# Patient Record
Sex: Female | Born: 1980
Health system: Southern US, Community
[De-identification: ages and names within clinical notes are randomized; demographics above are authoritative.]

## PROBLEM LIST (undated history)

## (undated) DIAGNOSIS — F419 Anxiety disorder, unspecified: Secondary | ICD-10-CM

## (undated) DIAGNOSIS — F32A Depression, unspecified: Secondary | ICD-10-CM

## (undated) DIAGNOSIS — E785 Hyperlipidemia, unspecified: Secondary | ICD-10-CM

## (undated) DIAGNOSIS — R451 Restlessness and agitation: Secondary | ICD-10-CM

## (undated) DIAGNOSIS — F329 Major depressive disorder, single episode, unspecified: Secondary | ICD-10-CM

## (undated) DIAGNOSIS — G4710 Hypersomnia, unspecified: Secondary | ICD-10-CM

## (undated) DIAGNOSIS — R002 Palpitations: Secondary | ICD-10-CM

## (undated) HISTORY — DX: Depression, unspecified: F32.A

## (undated) HISTORY — DX: Restlessness and agitation: R45.1

## (undated) HISTORY — DX: Palpitations: R00.2

## (undated) HISTORY — DX: Anxiety disorder, unspecified: F41.9

## (undated) HISTORY — PX: OTHER SURGICAL HISTORY: SHX169

## (undated) HISTORY — DX: Hypersomnia, unspecified: G47.10

## (undated) HISTORY — DX: Hyperlipidemia, unspecified: E78.5

## (undated) HISTORY — DX: Major depressive disorder, single episode, unspecified: F32.9

---

## 2001-03-04 ENCOUNTER — Ambulatory Visit (HOSPITAL_COMMUNITY): Admission: RE | Admit: 2001-03-04 | Discharge: 2001-03-04 | Payer: Self-pay | Admitting: Family Medicine

## 2004-02-10 ENCOUNTER — Other Ambulatory Visit: Admission: RE | Admit: 2004-02-10 | Discharge: 2004-02-10 | Payer: Self-pay | Admitting: Obstetrics and Gynecology

## 2004-10-27 ENCOUNTER — Ambulatory Visit (HOSPITAL_COMMUNITY): Admission: RE | Admit: 2004-10-27 | Discharge: 2004-10-27 | Payer: Self-pay | Admitting: *Deleted

## 2004-10-27 ENCOUNTER — Ambulatory Visit (HOSPITAL_BASED_OUTPATIENT_CLINIC_OR_DEPARTMENT_OTHER): Admission: RE | Admit: 2004-10-27 | Discharge: 2004-10-27 | Payer: Self-pay | Admitting: *Deleted

## 2004-10-27 ENCOUNTER — Encounter (INDEPENDENT_AMBULATORY_CARE_PROVIDER_SITE_OTHER): Payer: Self-pay | Admitting: *Deleted

## 2007-07-11 ENCOUNTER — Encounter: Admission: RE | Admit: 2007-07-11 | Discharge: 2007-07-11 | Payer: Self-pay | Admitting: Obstetrics and Gynecology

## 2009-11-09 ENCOUNTER — Ambulatory Visit (HOSPITAL_COMMUNITY): Admission: RE | Admit: 2009-11-09 | Discharge: 2009-11-09 | Payer: Self-pay | Admitting: Family Medicine

## 2010-04-25 ENCOUNTER — Ambulatory Visit: Payer: Self-pay | Admitting: Oncology

## 2010-04-27 LAB — LACTATE DEHYDROGENASE: LDH: 103 U/L (ref 94–250)

## 2010-04-27 LAB — COMPREHENSIVE METABOLIC PANEL
CO2: 24 mEq/L (ref 19–32)
Calcium: 9.3 mg/dL (ref 8.4–10.5)
Chloride: 107 mEq/L (ref 96–112)
Glucose, Bld: 100 mg/dL — ABNORMAL HIGH (ref 70–99)
Sodium: 138 mEq/L (ref 135–145)
Total Bilirubin: 0.6 mg/dL (ref 0.3–1.2)
Total Protein: 6.4 g/dL (ref 6.0–8.3)

## 2010-04-27 LAB — CBC WITH DIFFERENTIAL/PLATELET
Basophils Absolute: 0 10*3/uL (ref 0.0–0.1)
EOS%: 0.7 % (ref 0.0–7.0)
MCH: 31.6 pg (ref 25.1–34.0)
MCV: 88.3 fL (ref 79.5–101.0)
MONO%: 8.7 % (ref 0.0–14.0)
RBC: 3.72 10*6/uL (ref 3.70–5.45)
RDW: 13.6 % (ref 11.2–14.5)

## 2010-04-27 LAB — MORPHOLOGY

## 2010-04-28 LAB — HIV ANTIBODY (ROUTINE TESTING W REFLEX)

## 2010-04-28 LAB — HEPATITIS B SURFACE ANTIGEN: Hepatitis B Surface Ag: NEGATIVE

## 2010-04-28 LAB — TSH: TSH: 0.797 u[IU]/mL (ref 0.350–4.500)

## 2010-05-10 LAB — CBC WITH DIFFERENTIAL/PLATELET
BASO%: 0.3 % (ref 0.0–2.0)
EOS%: 0.6 % (ref 0.0–7.0)
HCT: 32 % — ABNORMAL LOW (ref 34.8–46.6)
MCH: 32.2 pg (ref 25.1–34.0)
MCHC: 35.4 g/dL (ref 31.5–36.0)
NEUT%: 79 % — ABNORMAL HIGH (ref 38.4–76.8)
RBC: 3.53 10*6/uL — ABNORMAL LOW (ref 3.70–5.45)
lymph#: 1.2 10*3/uL (ref 0.9–3.3)

## 2010-05-11 ENCOUNTER — Ambulatory Visit (HOSPITAL_COMMUNITY): Admission: RE | Admit: 2010-05-11 | Discharge: 2010-05-11 | Payer: Self-pay | Admitting: Obstetrics

## 2010-05-24 LAB — COMPREHENSIVE METABOLIC PANEL
AST: 17 U/L (ref 0–37)
Albumin: 3.5 g/dL (ref 3.5–5.2)
Alkaline Phosphatase: 52 U/L (ref 39–117)
Potassium: 3.8 mEq/L (ref 3.5–5.3)
Sodium: 137 mEq/L (ref 135–145)
Total Protein: 5.7 g/dL — ABNORMAL LOW (ref 6.0–8.3)

## 2010-05-24 LAB — CBC WITH DIFFERENTIAL/PLATELET
BASO%: 0.4 % (ref 0.0–2.0)
EOS%: 0.7 % (ref 0.0–7.0)
HGB: 12.1 g/dL (ref 11.6–15.9)
MCH: 31.4 pg (ref 25.1–34.0)
MCHC: 34.2 g/dL (ref 31.5–36.0)
RDW: 14.5 % (ref 11.2–14.5)
lymph#: 1.1 10*3/uL (ref 0.9–3.3)

## 2010-06-08 ENCOUNTER — Ambulatory Visit (HOSPITAL_COMMUNITY): Admission: RE | Admit: 2010-06-08 | Discharge: 2010-06-08 | Payer: Self-pay | Admitting: Obstetrics

## 2010-06-28 ENCOUNTER — Ambulatory Visit: Payer: Self-pay | Admitting: Oncology

## 2010-06-30 LAB — COMPREHENSIVE METABOLIC PANEL
ALT: 13 U/L (ref 0–35)
AST: 15 U/L (ref 0–37)
Albumin: 3.6 g/dL (ref 3.5–5.2)
Calcium: 8.4 mg/dL (ref 8.4–10.5)
Chloride: 105 mEq/L (ref 96–112)
Creatinine, Ser: 0.58 mg/dL (ref 0.40–1.20)
Potassium: 3.9 mEq/L (ref 3.5–5.3)
Sodium: 138 mEq/L (ref 135–145)
Total Protein: 5.9 g/dL — ABNORMAL LOW (ref 6.0–8.3)

## 2010-06-30 LAB — CBC WITH DIFFERENTIAL/PLATELET
BASO%: 0.1 % (ref 0.0–2.0)
Eosinophils Absolute: 0.1 10*3/uL (ref 0.0–0.5)
MCHC: 35.1 g/dL (ref 31.5–36.0)
MONO#: 0.5 10*3/uL (ref 0.1–0.9)
NEUT#: 7.1 10*3/uL — ABNORMAL HIGH (ref 1.5–6.5)
RBC: 3.95 10*6/uL (ref 3.70–5.45)
RDW: 14.2 % (ref 11.2–14.5)
WBC: 8.8 10*3/uL (ref 3.9–10.3)
lymph#: 1 10*3/uL (ref 0.9–3.3)

## 2010-06-30 LAB — CHCC SMEAR

## 2010-07-07 ENCOUNTER — Ambulatory Visit (HOSPITAL_COMMUNITY): Admission: RE | Admit: 2010-07-07 | Discharge: 2010-07-07 | Payer: Self-pay | Admitting: Obstetrics

## 2010-07-31 ENCOUNTER — Ambulatory Visit: Payer: Self-pay | Admitting: Oncology

## 2010-07-31 LAB — COMPREHENSIVE METABOLIC PANEL
ALT: 10 U/L (ref 0–35)
CO2: 21 mEq/L (ref 19–32)
Calcium: 8.3 mg/dL — ABNORMAL LOW (ref 8.4–10.5)
Chloride: 106 mEq/L (ref 96–112)
Glucose, Bld: 79 mg/dL (ref 70–99)
Sodium: 137 mEq/L (ref 135–145)
Total Protein: 5.4 g/dL — ABNORMAL LOW (ref 6.0–8.3)

## 2010-07-31 LAB — CBC WITH DIFFERENTIAL/PLATELET
EOS%: 0.3 % (ref 0.0–7.0)
Eosinophils Absolute: 0 10*3/uL (ref 0.0–0.5)
LYMPH%: 15.7 % (ref 14.0–49.7)
MCH: 31.7 pg (ref 25.1–34.0)
MCV: 91 fL (ref 79.5–101.0)
MONO%: 8.4 % (ref 0.0–14.0)
NEUT#: 6.4 10*3/uL (ref 1.5–6.5)
Platelets: 106 10*3/uL — ABNORMAL LOW (ref 145–400)
RBC: 3.8 10*6/uL (ref 3.70–5.45)
RDW: 14.1 % (ref 11.2–14.5)

## 2010-07-31 LAB — MORPHOLOGY: RBC Comments: NORMAL

## 2010-07-31 LAB — CHCC SMEAR

## 2010-08-02 ENCOUNTER — Encounter: Admission: RE | Admit: 2010-08-02 | Discharge: 2010-09-07 | Payer: Self-pay | Admitting: Obstetrics

## 2010-08-31 ENCOUNTER — Ambulatory Visit: Payer: Self-pay | Admitting: Oncology

## 2010-08-31 LAB — COMPREHENSIVE METABOLIC PANEL
ALT: 15 U/L (ref 0–35)
AST: 18 U/L (ref 0–37)
Albumin: 3.1 g/dL — ABNORMAL LOW (ref 3.5–5.2)
Alkaline Phosphatase: 101 U/L (ref 39–117)
BUN: 8 mg/dL (ref 6–23)
Calcium: 9 mg/dL (ref 8.4–10.5)
Chloride: 104 mEq/L (ref 96–112)
Potassium: 4.1 mEq/L (ref 3.5–5.3)
Sodium: 135 mEq/L (ref 135–145)
Total Protein: 6 g/dL (ref 6.0–8.3)

## 2010-08-31 LAB — URINALYSIS, MICROSCOPIC - CHCC
Bilirubin (Urine): NEGATIVE
Blood: NEGATIVE
Ketones: 15 mg/dL
Nitrite: NEGATIVE
Protein: NEGATIVE mg/dL
Specific Gravity, Urine: 1.01 (ref 1.003–1.035)
pH: 6.5 (ref 4.6–8.0)

## 2010-08-31 LAB — MORPHOLOGY: PLT EST: DECREASED

## 2010-08-31 LAB — CBC WITH DIFFERENTIAL/PLATELET
BASO%: 0.6 % (ref 0.0–2.0)
Basophils Absolute: 0.1 10*3/uL (ref 0.0–0.1)
HCT: 38.9 % (ref 34.8–46.6)
HGB: 13.3 g/dL (ref 11.6–15.9)
LYMPH%: 12.7 % — ABNORMAL LOW (ref 14.0–49.7)
MCH: 31.3 pg (ref 25.1–34.0)
MCHC: 34 g/dL (ref 31.5–36.0)
MONO#: 0.6 10*3/uL (ref 0.1–0.9)
NEUT%: 78.5 % — ABNORMAL HIGH (ref 38.4–76.8)
Platelets: 94 10*3/uL — ABNORMAL LOW (ref 145–400)
WBC: 8.2 10*3/uL (ref 3.9–10.3)

## 2010-09-01 LAB — URINE CULTURE

## 2010-09-07 LAB — CBC WITH DIFFERENTIAL/PLATELET
BASO%: 0.1 % (ref 0.0–2.0)
Basophils Absolute: 0 10*3/uL (ref 0.0–0.1)
EOS%: 0 % (ref 0.0–7.0)
HCT: 37.4 % (ref 34.8–46.6)
HGB: 12.7 g/dL (ref 11.6–15.9)
LYMPH%: 9 % — ABNORMAL LOW (ref 14.0–49.7)
MCH: 31 pg (ref 25.1–34.0)
MCHC: 34 g/dL (ref 31.5–36.0)
MCV: 91.2 fL (ref 79.5–101.0)
MONO%: 3.8 % (ref 0.0–14.0)
NEUT%: 87.1 % — ABNORMAL HIGH (ref 38.4–76.8)
Platelets: 108 10*3/uL — ABNORMAL LOW (ref 145–400)
lymph#: 1 10*3/uL (ref 0.9–3.3)

## 2010-09-14 ENCOUNTER — Ambulatory Visit: Payer: Self-pay | Admitting: Oncology

## 2010-09-14 LAB — COMPREHENSIVE METABOLIC PANEL
AST: 16 U/L (ref 0–37)
BUN: 11 mg/dL (ref 6–23)
CO2: 23 mEq/L (ref 19–32)
Calcium: 9.3 mg/dL (ref 8.4–10.5)
Chloride: 104 mEq/L (ref 96–112)
Creatinine, Ser: 0.68 mg/dL (ref 0.40–1.20)

## 2010-09-14 LAB — CBC WITH DIFFERENTIAL/PLATELET
Basophils Absolute: 0 10*3/uL (ref 0.0–0.1)
EOS%: 0.1 % (ref 0.0–7.0)
HCT: 39 % (ref 34.8–46.6)
HGB: 13.6 g/dL (ref 11.6–15.9)
LYMPH%: 8.7 % — ABNORMAL LOW (ref 14.0–49.7)
MCH: 31.8 pg (ref 25.1–34.0)
NEUT%: 87.9 % — ABNORMAL HIGH (ref 38.4–76.8)
Platelets: 130 10*3/uL — ABNORMAL LOW (ref 145–400)
lymph#: 0.9 10*3/uL (ref 0.9–3.3)

## 2010-09-21 ENCOUNTER — Inpatient Hospital Stay (HOSPITAL_COMMUNITY): Admission: AD | Admit: 2010-09-21 | Discharge: 2010-09-21 | Payer: Self-pay | Admitting: Obstetrics

## 2010-09-21 LAB — CBC WITH DIFFERENTIAL/PLATELET
Basophils Absolute: 0 10*3/uL (ref 0.0–0.1)
Eosinophils Absolute: 0 10*3/uL (ref 0.0–0.5)
HCT: 43.1 % (ref 34.8–46.6)
HGB: 14.9 g/dL (ref 11.6–15.9)
MONO#: 0.2 10*3/uL (ref 0.1–0.9)
NEUT%: 87.6 % — ABNORMAL HIGH (ref 38.4–76.8)
WBC: 11.6 10*3/uL — ABNORMAL HIGH (ref 3.9–10.3)
lymph#: 1.2 10*3/uL (ref 0.9–3.3)

## 2010-09-23 ENCOUNTER — Inpatient Hospital Stay (HOSPITAL_COMMUNITY): Admission: RE | Admit: 2010-09-23 | Discharge: 2010-09-26 | Payer: Self-pay | Admitting: Obstetrics

## 2010-10-02 LAB — CBC WITH DIFFERENTIAL/PLATELET
Basophils Absolute: 0.1 10*3/uL (ref 0.0–0.1)
EOS%: 0.2 % (ref 0.0–7.0)
HCT: 33.6 % — ABNORMAL LOW (ref 34.8–46.6)
HGB: 11.6 g/dL (ref 11.6–15.9)
MCH: 31.5 pg (ref 25.1–34.0)
MCV: 91.6 fL (ref 79.5–101.0)
MONO%: 3.8 % (ref 0.0–14.0)
NEUT%: 88.1 % — ABNORMAL HIGH (ref 38.4–76.8)
Platelets: 222 10*3/uL (ref 145–400)
lymph#: 0.8 10*3/uL — ABNORMAL LOW (ref 0.9–3.3)

## 2010-10-02 LAB — COMPREHENSIVE METABOLIC PANEL
AST: 14 U/L (ref 0–37)
BUN: 24 mg/dL — ABNORMAL HIGH (ref 6–23)
Calcium: 9.5 mg/dL (ref 8.4–10.5)
Chloride: 105 mEq/L (ref 96–112)
Creatinine, Ser: 0.76 mg/dL (ref 0.40–1.20)

## 2010-10-09 LAB — CBC WITH DIFFERENTIAL/PLATELET
Basophils Absolute: 0 10*3/uL (ref 0.0–0.1)
EOS%: 0.1 % (ref 0.0–7.0)
Eosinophils Absolute: 0 10*3/uL (ref 0.0–0.5)
HCT: 37.2 % (ref 34.8–46.6)
HGB: 12.4 g/dL (ref 11.6–15.9)
MCH: 30.2 pg (ref 25.1–34.0)
MONO#: 0.3 10*3/uL (ref 0.1–0.9)
NEUT%: 88.6 % — ABNORMAL HIGH (ref 38.4–76.8)
lymph#: 0.9 10*3/uL (ref 0.9–3.3)

## 2010-10-16 ENCOUNTER — Other Ambulatory Visit: Payer: Self-pay | Admitting: Oncology

## 2010-10-16 LAB — CBC WITH DIFFERENTIAL/PLATELET
Basophils Absolute: 0 10*3/uL (ref 0.0–0.1)
Eosinophils Absolute: 0 10*3/uL (ref 0.0–0.5)
HCT: 37.7 % (ref 34.8–46.6)
HGB: 12.7 g/dL (ref 11.6–15.9)
LYMPH%: 15.7 % (ref 14.0–49.7)
MONO#: 0.4 10*3/uL (ref 0.1–0.9)
NEUT#: 5.3 10*3/uL (ref 1.5–6.5)
Platelets: 192 10*3/uL (ref 145–400)
RBC: 4.23 10*6/uL (ref 3.70–5.45)
WBC: 6.7 10*3/uL (ref 3.9–10.3)

## 2010-10-25 ENCOUNTER — Other Ambulatory Visit: Payer: Self-pay | Admitting: Oncology

## 2010-10-25 LAB — COMPREHENSIVE METABOLIC PANEL
CO2: 26 mEq/L (ref 19–32)
Creatinine, Ser: 0.92 mg/dL (ref 0.40–1.20)
Glucose, Bld: 130 mg/dL — ABNORMAL HIGH (ref 70–99)
Total Bilirubin: 0.4 mg/dL (ref 0.3–1.2)

## 2010-10-25 LAB — CBC WITH DIFFERENTIAL/PLATELET
BASO%: 0.4 % (ref 0.0–2.0)
Eosinophils Absolute: 0.1 10*3/uL (ref 0.0–0.5)
HCT: 39.4 % (ref 34.8–46.6)
LYMPH%: 14.7 % (ref 14.0–49.7)
MCHC: 33.3 g/dL (ref 31.5–36.0)
MCV: 88 fL (ref 79.5–101.0)
MONO#: 0.5 10*3/uL (ref 0.1–0.9)
NEUT%: 78 % — ABNORMAL HIGH (ref 38.4–76.8)
Platelets: 167 10*3/uL (ref 145–400)
WBC: 7.6 10*3/uL (ref 3.9–10.3)

## 2010-10-30 ENCOUNTER — Other Ambulatory Visit: Payer: Self-pay | Admitting: Oncology

## 2010-10-30 LAB — CBC WITH DIFFERENTIAL/PLATELET
Eosinophils Absolute: 0.1 10*3/uL (ref 0.0–0.5)
HCT: 36.8 % (ref 34.8–46.6)
LYMPH%: 24.4 % (ref 14.0–49.7)
MONO#: 0.7 10*3/uL (ref 0.1–0.9)
NEUT#: 4.2 10*3/uL (ref 1.5–6.5)
NEUT%: 63.4 % (ref 38.4–76.8)
Platelets: 151 10*3/uL (ref 145–400)
WBC: 6.7 10*3/uL (ref 3.9–10.3)

## 2010-11-08 ENCOUNTER — Other Ambulatory Visit: Payer: Self-pay | Admitting: Oncology

## 2010-11-08 LAB — CBC WITH DIFFERENTIAL/PLATELET
BASO%: 0.4 % (ref 0.0–2.0)
HCT: 38.9 % (ref 34.8–46.6)
LYMPH%: 29.3 % (ref 14.0–49.7)
MCH: 28.8 pg (ref 25.1–34.0)
MCHC: 33.3 g/dL (ref 31.5–36.0)
MONO#: 0.6 10*3/uL (ref 0.1–0.9)
NEUT%: 59.7 % (ref 38.4–76.8)
Platelets: 156 10*3/uL (ref 145–400)

## 2010-11-08 LAB — COMPREHENSIVE METABOLIC PANEL
ALT: 20 U/L (ref 0–35)
BUN: 19 mg/dL (ref 6–23)
CO2: 27 mEq/L (ref 19–32)
Creatinine, Ser: 0.86 mg/dL (ref 0.40–1.20)
Total Bilirubin: 0.4 mg/dL (ref 0.3–1.2)

## 2010-12-01 ENCOUNTER — Ambulatory Visit (HOSPITAL_BASED_OUTPATIENT_CLINIC_OR_DEPARTMENT_OTHER): Payer: 59 | Admitting: Oncology

## 2010-12-06 LAB — CBC WITH DIFFERENTIAL/PLATELET
Basophils Absolute: 0 10*3/uL (ref 0.0–0.1)
Eosinophils Absolute: 0.1 10*3/uL (ref 0.0–0.5)
HCT: 41.8 % (ref 34.8–46.6)
HGB: 14 g/dL (ref 11.6–15.9)
MCH: 28.4 pg (ref 25.1–34.0)
MONO#: 0.6 10*3/uL (ref 0.1–0.9)
NEUT#: 5 10*3/uL (ref 1.5–6.5)
NEUT%: 66.9 % (ref 38.4–76.8)
RDW: 14.5 % (ref 11.2–14.5)
WBC: 7.4 10*3/uL (ref 3.9–10.3)
lymph#: 1.8 10*3/uL (ref 0.9–3.3)

## 2010-12-31 ENCOUNTER — Encounter: Payer: Self-pay | Admitting: Obstetrics

## 2011-01-01 LAB — CBC WITH DIFFERENTIAL/PLATELET
BASO%: 0.3 % (ref 0.0–2.0)
Basophils Absolute: 0 10*3/uL (ref 0.0–0.1)
Eosinophils Absolute: 0 10*3/uL (ref 0.0–0.5)
HCT: 40.4 % (ref 34.8–46.6)
HGB: 13.5 g/dL (ref 11.6–15.9)
MCHC: 33.5 g/dL (ref 31.5–36.0)
MONO#: 0.4 10*3/uL (ref 0.1–0.9)
NEUT#: 3.8 10*3/uL (ref 1.5–6.5)
NEUT%: 64.3 % (ref 38.4–76.8)
Platelets: 130 10*3/uL — ABNORMAL LOW (ref 145–400)
WBC: 5.9 10*3/uL (ref 3.9–10.3)
lymph#: 1.7 10*3/uL (ref 0.9–3.3)

## 2011-01-15 ENCOUNTER — Encounter: Payer: 59 | Admitting: Oncology

## 2011-01-15 DIAGNOSIS — D696 Thrombocytopenia, unspecified: Secondary | ICD-10-CM

## 2011-01-15 DIAGNOSIS — IMO0002 Reserved for concepts with insufficient information to code with codable children: Secondary | ICD-10-CM

## 2011-01-15 DIAGNOSIS — O99810 Abnormal glucose complicating pregnancy: Secondary | ICD-10-CM

## 2011-01-15 LAB — CBC WITH DIFFERENTIAL/PLATELET
Basophils Absolute: 0 10*3/uL (ref 0.0–0.1)
EOS%: 0.5 % (ref 0.0–7.0)
HCT: 40.2 % (ref 34.8–46.6)
HGB: 13.6 g/dL (ref 11.6–15.9)
LYMPH%: 25.8 % (ref 14.0–49.7)
MCH: 28.2 pg (ref 25.1–34.0)
MCV: 83.7 fL (ref 79.5–101.0)
MONO%: 7.6 % (ref 0.0–14.0)
NEUT%: 65.6 % (ref 38.4–76.8)
Platelets: 144 10*3/uL — ABNORMAL LOW (ref 145–400)

## 2011-02-02 ENCOUNTER — Encounter (HOSPITAL_BASED_OUTPATIENT_CLINIC_OR_DEPARTMENT_OTHER): Payer: 59 | Admitting: Oncology

## 2011-02-02 ENCOUNTER — Other Ambulatory Visit: Payer: Self-pay | Admitting: Oncology

## 2011-02-02 DIAGNOSIS — IMO0002 Reserved for concepts with insufficient information to code with codable children: Secondary | ICD-10-CM

## 2011-02-02 DIAGNOSIS — O99810 Abnormal glucose complicating pregnancy: Secondary | ICD-10-CM

## 2011-02-02 DIAGNOSIS — D696 Thrombocytopenia, unspecified: Secondary | ICD-10-CM

## 2011-02-02 LAB — CBC WITH DIFFERENTIAL/PLATELET
Basophils Absolute: 0 10*3/uL (ref 0.0–0.1)
Eosinophils Absolute: 0 10*3/uL (ref 0.0–0.5)
HCT: 42.3 % (ref 34.8–46.6)
HGB: 14.3 g/dL (ref 11.6–15.9)
LYMPH%: 27.2 % (ref 14.0–49.7)
MCHC: 33.8 g/dL (ref 31.5–36.0)
MONO#: 0.4 10*3/uL (ref 0.1–0.9)
NEUT#: 3.7 10*3/uL (ref 1.5–6.5)
NEUT%: 64.5 % (ref 38.4–76.8)
Platelets: 159 10*3/uL (ref 145–400)
WBC: 5.8 10*3/uL (ref 3.9–10.3)

## 2011-02-02 LAB — COMPREHENSIVE METABOLIC PANEL
BUN: 12 mg/dL (ref 6–23)
CO2: 24 mEq/L (ref 19–32)
Calcium: 9.6 mg/dL (ref 8.4–10.5)
Chloride: 106 mEq/L (ref 96–112)
Creatinine, Ser: 1 mg/dL (ref 0.40–1.20)
Glucose, Bld: 92 mg/dL (ref 70–99)
Total Bilirubin: 0.5 mg/dL (ref 0.3–1.2)

## 2011-02-21 LAB — CBC
Hemoglobin: 12 g/dL (ref 12.0–15.0)
MCH: 31.9 pg (ref 26.0–34.0)
MCHC: 34.7 g/dL (ref 30.0–36.0)
MCV: 93.1 fL (ref 78.0–100.0)
MCV: 93.4 fL (ref 78.0–100.0)
Platelets: 107 10*3/uL — ABNORMAL LOW (ref 150–400)
Platelets: 117 10*3/uL — ABNORMAL LOW (ref 150–400)
Platelets: 122 10*3/uL — ABNORMAL LOW (ref 150–400)
RBC: 3.06 MIL/uL — ABNORMAL LOW (ref 3.87–5.11)
RBC: 4.27 MIL/uL (ref 3.87–5.11)
RDW: 14.7 % (ref 11.5–15.5)
WBC: 15.7 10*3/uL — ABNORMAL HIGH (ref 4.0–10.5)

## 2011-02-21 LAB — GLUCOSE, CAPILLARY
Glucose-Capillary: 107 mg/dL — ABNORMAL HIGH (ref 70–99)
Glucose-Capillary: 107 mg/dL — ABNORMAL HIGH (ref 70–99)
Glucose-Capillary: 169 mg/dL — ABNORMAL HIGH (ref 70–99)
Glucose-Capillary: 198 mg/dL — ABNORMAL HIGH (ref 70–99)
Glucose-Capillary: 225 mg/dL — ABNORMAL HIGH (ref 70–99)
Glucose-Capillary: 65 mg/dL — ABNORMAL LOW (ref 70–99)
Glucose-Capillary: 69 mg/dL — ABNORMAL LOW (ref 70–99)
Glucose-Capillary: 75 mg/dL (ref 70–99)
Glucose-Capillary: 77 mg/dL (ref 70–99)
Glucose-Capillary: 86 mg/dL (ref 70–99)

## 2011-04-27 NOTE — Op Note (Signed)
NAMEFAYDRA, Ashley Gardner              ACCOUNT NO.:  [id number]   MEDICAL RECORD NO.:  [id number]          PATIENT TYPE:  AMB   LOCATION:  DSC                          FACILITY:  [facility]   PHYSICIAN:  Kathy Breach, M.D.      DATE OF BIRTH:  1981/10/01   DATE OF PROCEDURE:  10/27/2004  DATE OF DISCHARGE:                                 OPERATIVE REPORT   PREOPERATIVE DIAGNOSIS:  Left chronic anterior ethmoid/maxillary sinusitis.   POSTOPERATIVE DIAGNOSIS:  Left chronic anterior ethmoid/maxillary sinusitis.   OPERATIVE PROCEDURE:  Endoscopic left anterior ethmoidectomy with  osteomeatal enlargement.   DESCRIPTION OF PROCEDURE:  With the patient under general orotracheal  anesthesia, a nasal block was applied to the sphenopalatine and anterior  ethmoid nerve areas bilaterally with cotton tipped probes soaked with 4%  Xylocaine with Ephedrine solution.  Cotton pledgets soaked in a similar  solution were inserted along the middle and inferior turbinates.  The left  middle meatal and middle turbinate were infiltrated with 1% Xylocaine with  1:100,000 epinephrine under direct visualization for vasoconstrictive  efforts.  Inspection of the patient's nose after decongestion showed a  little mucopurulent discharge posteriorly on the left side into the  nasopharynx.  Under endoscopic visualization, the left middle turbinate was  medially fractured to expose the left middle meatus.  Prominent infundibulum  was present.  With a sickle knife, incision was made along the inferior and  anterior aspects of the curved infundibulum which was then removed.  This  encountered immediately inflammatory polypoid mucosa filling the hiatus  space.  Anterior ethmoid cell was taken down filled with inflammatory  tissue.  The more anterior agar nasi cell opaque on the preop films was also  taken, obliterated, and removed.  The area of the osteomeatal window was  probed as it was not directly visible with the  inflammatory changes,  located, and opened.  There was light yellow creamy purulence within the  left maxillary sinus.  Culture was obtained.  The sinus was completely  aspirated of minimal contents at this time.  With the left osteomeatal  window nicely opened, the anterior ethmoidectomy completed, no significant  bleeding after topical packs with the 4% Xylocaine Ephedrine solution  reinserted for a few minutes.  Estimated blood loss for the procedure is  less than 30 mL.  The patient's oropharynx was suctioned, there was no  bleeding from the nasopharynx.  The patient tolerated the procedure well and  was taken to the recovery room in stable, general condition.       JGL/MEDQ  D:  10/27/2004  T:  10/27/2004  Job:  [id number]

## 2015-01-25 ENCOUNTER — Encounter: Payer: 59 | Attending: Obstetrics | Admitting: Dietician

## 2015-01-25 ENCOUNTER — Encounter: Payer: Self-pay | Admitting: Dietician

## 2015-01-25 VITALS — Ht 60.0 in | Wt 133.6 lb

## 2015-01-25 DIAGNOSIS — E785 Hyperlipidemia, unspecified: Secondary | ICD-10-CM | POA: Insufficient documentation

## 2015-01-25 DIAGNOSIS — Z713 Dietary counseling and surveillance: Secondary | ICD-10-CM | POA: Insufficient documentation

## 2015-01-25 NOTE — Progress Notes (Signed)
Medical Nutrition Therapy:  Appt start time: 0930 end time:  1030.   Assessment:  Primary concerns today: Ashley Gardner is here today is here since she is "always hungry" and would like to lose weight. Cholesterol was also high (250). Started on qysmia and lost 8 lbs on the first month and was still hungry (started in December). Then tried contrave which did not work. Has started Belviq which is really helping an she is feeling full though she has not lost more weight. Current BMI is 26.1  Feels like she might be eating out of anxiety. This started during her pregnancy 4 years ago. Was put on predinsone d/t low platelets during last 4 weeks of pregnancy and 2 months after delivery. Developed gestational diabetes.  Does not feel like stress level has increased since before having her baby. Feels like she may eat of boredom. No longer hungry but is still eating. Has a sit down job.   Average body weight pre-pregnancy/goal is 115-120 lbs.    Really likes sweets. Does not like many vegetables. Son is also picky.   Preferred Learning Style:   No preference indicated   Learning Readiness:   Ready  MEDICATIONS: Belvia   DIETARY INTAKE:  Usual eating pattern includes 3 meals and 3 snacks per day.  Avoided foods include: doesn't like a lot of chicken, fish, most vegetables (can eat them but won't make them at home)    24-hr recall:  B ( AM): McDonald's or [place] (chicken biscuit) or Special K with silk almond milk unsweetened or Special K meal bars with diet dr. Reino Kent Snk ( AM): candy (m&ms), fruit or "whatever she can find" or goes the bakery  L ( PM): always eats out ([employer] or [employer] station) this week using smart ones or peanut butter sandwich  Snk ( PM): candy (m&ms), fruit or "whatever she can find" or goes the bakery  D ( PM): cereal or McDonald's, husband might cook spaghetti or chicken, lasagna  Snk ( PM): candy, fruit snacks, fruit Beverages: 1-2 bottles of  water per day, 2 can of diet dr. Reino Kent  Usual physical activity: runs for 60 minutes for 5 x week  Estimated energy needs: 1800 calories 200 g carbohydrates 135 g protein 50 g fat  Progress Towards Goal(s):  In progress.   Nutritional Diagnosis:  NB-1.1 Food and nutrition-related knowledge deficit As related to frequent restaurant/low fiber meals, lack of vegetable intake, .  As evidenced by diet recall.    Intervention:  Nutrition counseling provided. Plan: Try making ranch dressing with plain greek yogurt and Hidden [employer]. Bring carrots, cucumbers, or lettuce to work with your lunch (smart one or sandwich).  Have dressing on the side for salads.  As a family try one new vegetable per week. (think about roasting on 400 for about 30 minutes with olive oil, salt and pepper). Stop having M&M's or having them one time per week.  Plan to go out to eat lunch 2 x week. Think about going for a walk in the middle of the day instead of going out to lunch. Try having snacks when you are not distracted (computer, phone, TV). Try to sip on more water throughout the day. Have protein with carbs for snacks and breakfast (to help fill you up). Try to increase foods with fiber (vegetables, fruit, beans, whole wheat bread or pasta).  Teaching Method Utilized:  Visual Auditory Hands on  Handouts given during visit include:  Yellow Card  MyPlate Handout  15 g  CHO snacks   Barriers to learning/adherence to lifestyle change: picky eating, feeling hungry  Demonstrated degree of understanding via:  Teach Back   Monitoring/Evaluation:  Dietary intake, exercise, and body weight in 1 month(s).

## 2015-01-25 NOTE — Patient Instructions (Signed)
Try making ranch dressing with plain greek yogurt and Hidden [employer]. Bring carrots, cucumbers, or lettuce to work with your lunch (smart one or sandwich).  Have dressing on the side for salads.  As a family try one new vegetable per week. (think about roasting on 400 for about 30 minutes with olive oil, salt and pepper). Stop having M&M's or having them one time per week.  Plan to go out to eat lunch 2 x week. Think about going for a walk in the middle of the day instead of going out to lunch. Try having snacks when you are not distracted (computer, phone, TV). Try to sip on more water throughout the day. Have protein with carbs for snacks and breakfast (to help fill you up). Try to increase foods with fiber (vegetables, fruit, beans, whole wheat bread or pasta).

## 2015-03-09 ENCOUNTER — Encounter: Payer: 59 | Attending: Obstetrics | Admitting: Dietician

## 2015-03-09 VITALS — Ht 60.0 in | Wt 134.7 lb

## 2015-03-09 DIAGNOSIS — Z713 Dietary counseling and surveillance: Secondary | ICD-10-CM | POA: Diagnosis not present

## 2015-03-09 DIAGNOSIS — E785 Hyperlipidemia, unspecified: Secondary | ICD-10-CM | POA: Diagnosis not present

## 2015-03-09 NOTE — Progress Notes (Signed)
  Medical Nutrition Therapy:  Appt start time: 0730 end time:  755.   Assessment:  Primary concerns today: Ashley Gardner is here today for a follow up for healthy eating and hyperlipidemia. Gained about 1 lb since last time. Added snacks about every 2 hours and she is not hungry and will not over eat. Started eating more vegetables and her child is eating more vegetables. Not having as many cravings as before and hasn't had fast food since last appointment. Has not been able to work out since she is working extra hours. Did eat a lot more when she was off of Belviq for about 10 days. Has been cutting back on carbs. Has not been having M&Ms in the past 2 weeks. Craving hummus and salads now.  Overall feeling better and wants to continue with her healthy eating plan.   Preferred Learning Style:   No preference indicated   Learning Readiness:   Ready  MEDICATIONS: Belvia   DIETARY INTAKE:  Usual eating pattern includes 3 meals and 3 snacks per day.  Avoided foods include: doesn't like a lot of chicken, fish, most vegetables (can eat them but won't make them at home)    24-hr recall:  B ( AM): [employer] meal bars  Snk ( AM): hummus with carrots or pretzel sticks L ( PM):turkey slices and cheese rolled up with 1/2 carton yogurt or tuna with crackers Snk ( PM): 1/2 banana with peanut butter or apple  D ( PM): cereal  Snk ( PM): none Beverages: 4-5 bottles of water per day, coffee with cream and splenda    Usual physical activity: none recently d/t work schedule, going to start back soon  Estimated energy needs: 1800 calories 200 g carbohydrates 135 g protein 50 g fat  Progress Towards Goal(s):  In progress.   Nutritional Diagnosis:  NB-1.1 Food and nutrition-related knowledge deficit As related to frequent restaurant/low fiber meals, lack of vegetable intake, .  As evidenced by diet recall.    Intervention:  Nutrition counseling provided. Plan: Try making ranch dressing with plain  greek yogurt and Hidden [employer]. Continue exercising for 30 minutes when you can. Plan to go out to eat lunch 1 x week. Continue having snacks every 2-3 hours with protein and fiber (fill you up). Continue trying new vegetables and having some at lunch and dinner.   Teaching Method Utilized:  Visual Auditory Hands on   Barriers to learning/adherence to lifestyle change: picky eating, feeling hungry  Demonstrated degree of understanding via:  Teach Back   Monitoring/Evaluation:  Dietary intake, exercise, and body weight in 6 week(s).

## 2015-03-09 NOTE — Patient Instructions (Addendum)
Try making ranch dressing with plain greek yogurt and Hidden [employer]. Continue exercising for 30 minutes when you can. Plan to go out to eat lunch 1 x week. Continue having snacks every 2-3 hours with protein and fiber (fill you up). Continue trying new vegetables and having some at lunch and dinner.

## 2015-04-20 ENCOUNTER — Ambulatory Visit: Payer: 59 | Admitting: Dietician

## 2015-05-10 ENCOUNTER — Ambulatory Visit: Payer: 59 | Admitting: Dietician

## 2016-02-09 DIAGNOSIS — Z209 Contact with and (suspected) exposure to unspecified communicable disease: Secondary | ICD-10-CM | POA: Diagnosis not present

## 2016-10-19 MED FILL — CLINDAMYCIN HCL 150 MG CAP: 150 | 8 days supply | Qty: 28 | Fill #0

## 2017-08-06 DIAGNOSIS — R002 Palpitations: Secondary | ICD-10-CM | POA: Diagnosis not present

## 2017-08-06 DIAGNOSIS — F418 Other specified anxiety disorders: Secondary | ICD-10-CM | POA: Insufficient documentation

## 2017-08-06 DIAGNOSIS — G4700 Insomnia, unspecified: Secondary | ICD-10-CM | POA: Insufficient documentation

## 2017-08-06 DIAGNOSIS — R079 Chest pain, unspecified: Secondary | ICD-10-CM | POA: Diagnosis not present

## 2017-08-06 DIAGNOSIS — F322 Major depressive disorder, single episode, severe without psychotic features: Secondary | ICD-10-CM | POA: Insufficient documentation

## 2017-08-06 DIAGNOSIS — R030 Elevated blood-pressure reading, without diagnosis of hypertension: Secondary | ICD-10-CM | POA: Insufficient documentation

## 2017-08-06 DIAGNOSIS — F411 Generalized anxiety disorder: Secondary | ICD-10-CM | POA: Insufficient documentation

## 2017-08-06 DIAGNOSIS — F320 Major depressive disorder, single episode, mild: Secondary | ICD-10-CM | POA: Insufficient documentation

## 2017-08-06 DIAGNOSIS — F419 Anxiety disorder, unspecified: Secondary | ICD-10-CM | POA: Diagnosis not present

## 2017-08-06 MED FILL — FLUoxetine HCL 20 MG TABS: 20 | 30 days supply | Qty: 30 | Fill #0

## 2017-08-06 MED FILL — hydrOXYzine HCL 25 MG TABS: 25 | 10 days supply | Qty: 30 | Fill #0

## 2017-08-20 DIAGNOSIS — F322 Major depressive disorder, single episode, severe without psychotic features: Secondary | ICD-10-CM | POA: Diagnosis not present

## 2017-08-20 DIAGNOSIS — F411 Generalized anxiety disorder: Secondary | ICD-10-CM | POA: Diagnosis not present

## 2017-08-22 ENCOUNTER — Other Ambulatory Visit: Payer: Self-pay

## 2017-08-22 NOTE — Telephone Encounter (Signed)
SENT NOTE TO SCHEDULING 

## 2017-09-06 MED FILL — FLUoxetine HCL 20 MG TABS: 20 | 30 days supply | Qty: 30 | Fill #1

## 2017-09-18 DIAGNOSIS — Z01419 Encounter for gynecological examination (general) (routine) without abnormal findings: Secondary | ICD-10-CM | POA: Diagnosis not present

## 2017-09-18 DIAGNOSIS — Z1151 Encounter for screening for human papillomavirus (HPV): Secondary | ICD-10-CM | POA: Diagnosis not present

## 2017-09-18 DIAGNOSIS — Z6829 Body mass index (BMI) 29.0-29.9, adult: Secondary | ICD-10-CM | POA: Diagnosis not present

## 2017-09-25 DIAGNOSIS — Z3200 Encounter for pregnancy test, result unknown: Secondary | ICD-10-CM | POA: Diagnosis not present

## 2017-09-25 DIAGNOSIS — Z30430 Encounter for insertion of intrauterine contraceptive device: Secondary | ICD-10-CM | POA: Diagnosis not present

## 2017-09-26 DIAGNOSIS — F411 Generalized anxiety disorder: Secondary | ICD-10-CM | POA: Diagnosis not present

## 2017-09-26 DIAGNOSIS — F331 Major depressive disorder, recurrent, moderate: Secondary | ICD-10-CM | POA: Diagnosis not present

## 2017-09-26 DIAGNOSIS — R030 Elevated blood-pressure reading, without diagnosis of hypertension: Secondary | ICD-10-CM | POA: Diagnosis not present

## 2017-09-26 MED FILL — FLUoxetine HCL 20 MG CAPS: 20 | 30 days supply | Qty: 60 | Fill #0

## 2017-09-26 MED FILL — METOPROLOL SUCC ER 25 MG TA: 25 | 30 days supply | Qty: 30 | Fill #0

## 2017-09-30 ENCOUNTER — Encounter: Payer: Self-pay | Admitting: Interventional Cardiology

## 2017-09-30 ENCOUNTER — Ambulatory Visit (INDEPENDENT_AMBULATORY_CARE_PROVIDER_SITE_OTHER): Payer: 59 | Admitting: Interventional Cardiology

## 2017-09-30 VITALS — BP 140/96 | HR 68 | Ht 60.0 in | Wt 159.8 lb

## 2017-09-30 DIAGNOSIS — Z8249 Family history of ischemic heart disease and other diseases of the circulatory system: Secondary | ICD-10-CM | POA: Diagnosis not present

## 2017-09-30 DIAGNOSIS — R002 Palpitations: Secondary | ICD-10-CM | POA: Diagnosis not present

## 2017-09-30 DIAGNOSIS — R030 Elevated blood-pressure reading, without diagnosis of hypertension: Secondary | ICD-10-CM

## 2017-09-30 DIAGNOSIS — R0789 Other chest pain: Secondary | ICD-10-CM

## 2017-09-30 NOTE — Progress Notes (Signed)
Cardiology Office Note   Date:  09/30/2017   ID:  KAELEA GATHRIGHT, DOB 09/28/1981, MRN [id number]  PCP:  Noland Fordyce, MD ; now Dr. Devra Dopp   No chief complaint on file.  CP,    Wt Readings from Last 3 Encounters:  09/30/17 159 lb 12.8 oz (72.5 kg)  03/09/15 134 lb 11.2 oz (61.1 kg)  01/25/15 133 lb 9.6 oz (60.6 kg)       History of Present Illness: Ashley Gardner is a 36 y.o. female who is being seen today for the evaluation of chest discomfort and palpitations at the request of Noland Fordyce, MD.  She has a significant family h/o CAD.  Both parents have had CAD.    She is not exercising regularly but wants to get back into exercise.    She was started on metoprolol for palpitations and some elevated BP reading.    She previously was drinking coffee and soda daily.  She has now stopped coffee and monster drink was started.  She does not think that it is affecting her palpitations.    Past Medical History:  Diagnosis Date  . Anxiety    Decreased concentration, Excessive worry, insomnia, irritability, nervous/anxious behavior,obessions(food) restlessness.  . Depression    Feelings of hopelessness, feeling of worthlessness  . Hyperlipidemia   . Hypersomnia   . Palpitations   . Restlessness     Past Surgical History:  Procedure Laterality Date  . LEFT MASS BIOPSY     Benign appearing fragments ofbone and fibrous tissue, deferential      Current Outpatient Prescriptions  Medication Sig Dispense Refill  . FLUoxetine (PROZAC) 20 MG tablet Take 40 mg by mouth daily.    . hydrOXYzine (ATARAX/VISTARIL) 25 MG tablet Take 20 mg by mouth daily.    Marland Kitchen LILETTA, 52 MG, 19.5 MCG/DAY IUD by Intrauterine route.    . metoprolol succinate (TOPROL-XL) 25 MG 24 hr tablet Take 25 mg by mouth daily.  1   No current facility-administered medications for this visit.     Allergies:   Penicillins    Social History:  The patient  reports that she has never  smoked. She has never used smokeless tobacco. She reports that she drinks alcohol. She reports that she does not use drugs.   Family History:  The patient's family history includes Bipolar disorder in her mother; COPD in her mother; Depression in her mother; Diabetes in her mother; Heart attack in her father and mother; Hyperlipidemia in her father; Hypertension in her father and mother; Stroke in her father.    ROS:  Please see the history of present illness.   Otherwise, review of systems are positive for anxiety.   All other systems are reviewed and negative.    PHYSICAL EXAM: VS:  BP (!) 140/96   Pulse 68   Ht 5' (1.524 m)   Wt 159 lb 12.8 oz (72.5 kg)   SpO2 98%   BMI 31.21 kg/m  , BMI Body mass index is 31.21 kg/m. GEN: Well nourished, well developed, in no acute distress  HEENT: normal  Neck: no JVD, carotid bruits, or masses Cardiac: RRR; no murmurs, rubs, or gallops,no edema  Respiratory:  clear to auscultation bilaterally, normal work of breathing GI: soft, nontender, nondistended, + BS MS: no deformity or atrophy  Skin: warm and dry, no rash Neuro:  Strength and sensation are intact Psych: euthymic mood, full affect   EKG:   The ekg ordered today demonstrates  normal ECG   Recent Labs: No results found for requested labs within last 8760 hours.   Lipid Panel No results found for: CHOL, TRIG, HDL, CHOLHDL, VLDL, LDLCALC, LDLDIRECT   Other studies Reviewed: Additional studies/ records that were reviewed today with results demonstrating: .   ASSESSMENT AND PLAN:  1. Atypical chest pain: Normal ECG. Symptoms are related more to stress. Will plan for exercise treadmill test given her other risk factors. I have a low suspicion that she has obstructive coronary artery disease at this time. 2. Palpitations: Plan for 30 day event monitor. I would be surprised if we found any sustained arrhythmias. She more likely is having symptomatic premature beats. We did talk about  caffeine intake. She does not feel that her energy drinks or playing a role in her palpitations, as the symptoms started before she started using energy drinks. 3. Family h/o CAD:  I see her father, Brynda GreathouseRandall corns. He had early atherosclerosis with multiple PCI. I commended her for trying to be proactive with her health. She needs to have her lipids checked and managed aggressively. Continue aggressive preventative therapy including healthy diet and regular exercise after her stress test. 4. Elevated blood pressure reading without diagnosis of hypertension: Would like to see if her blood pressure comes down with lifestyle modification such as increased exercise and decreased caffeine intake. Currently taking metoprolol to help with both blood pressure and palpitations.   Current medicines are reviewed at length with the patient today.  The patient concerns regarding her medicines were addressed.  The following changes have been made:  No change  Labs/ tests ordered today include:  No orders of the defined types were placed in this encounter.   Recommend 150 minutes/week of aerobic exercise Low fat, low carb, high fiber diet recommended  Disposition:   FU in based on test results   Signed, Lance MussJayadeep Varanasi, MD  09/30/2017 9:53 AM    [facility] [address], [place], [place]  [address] Phone: [phone]; Fax: [phone]

## 2017-09-30 NOTE — Patient Instructions (Signed)
Medication Instructions:  Your physician recommends that you continue on your current medications as directed. Please refer to the Current Medication list given to you today.   Labwork: None ordered  Testing/Procedures: Your physician has recommended that you wear an event monitor. Event monitors are medical devices that record the heart's electrical activity. Doctors most often us these monitors to diagnose arrhythmias. Arrhythmias are problems with the speed or rhythm of the heartbeat. The monitor is a small, portable device. You can wear one while you do your normal daily activities. This is usually used to diagnose what is causing palpitations/syncope (passing out).  Your physician has requested that you have an exercise tolerance test. For further information please visit [email or web address]. Please also follow instruction sheet, as given.   Follow-Up: Your physician wants you to follow-up AS NEEDED  Any Other Special Instructions Will Be Listed Below (If Applicable).     If you need a refill on your cardiac medications before your next appointment, please call your pharmacy.

## 2017-10-09 ENCOUNTER — Ambulatory Visit (INDEPENDENT_AMBULATORY_CARE_PROVIDER_SITE_OTHER): Payer: 59

## 2017-10-09 DIAGNOSIS — R0789 Other chest pain: Secondary | ICD-10-CM

## 2017-10-09 DIAGNOSIS — R002 Palpitations: Secondary | ICD-10-CM | POA: Diagnosis not present

## 2017-10-09 LAB — EXERCISE TOLERANCE TEST
CHL CUP MPHR: 183 {beats}/min
CHL CUP RESTING HR STRESS: 93 {beats}/min
CSEPEW: 10.1 METS
CSEPPHR: 171 {beats}/min
Exercise duration (min): 8 min
Exercise duration (sec): 0 s
Percent HR: 93 %
RPE: 17

## 2017-10-10 DIAGNOSIS — R002 Palpitations: Secondary | ICD-10-CM | POA: Diagnosis not present

## 2017-10-24 DIAGNOSIS — Z131 Encounter for screening for diabetes mellitus: Secondary | ICD-10-CM | POA: Diagnosis not present

## 2017-10-24 DIAGNOSIS — R102 Pelvic and perineal pain: Secondary | ICD-10-CM | POA: Diagnosis not present

## 2017-10-24 DIAGNOSIS — Z13220 Encounter for screening for lipoid disorders: Secondary | ICD-10-CM | POA: Diagnosis not present

## 2017-10-24 DIAGNOSIS — Z30431 Encounter for routine checking of intrauterine contraceptive device: Secondary | ICD-10-CM | POA: Diagnosis not present

## 2017-10-28 MED FILL — METOPROLOL SUCC ER 25 MG TA: 25 | 30 days supply | Qty: 30 | Fill #1

## 2017-11-01 MED FILL — FLUoxetine HCL 20 MG CAPS: 20 | 30 days supply | Qty: 60 | Fill #1

## 2017-11-04 MED FILL — SAXENDA 18 MG/3 ML PEN: 18 | 30 days supply | Qty: 15 | Fill #0

## 2017-11-14 ENCOUNTER — Telehealth: Payer: Self-pay | Admitting: Interventional Cardiology

## 2017-11-14 DIAGNOSIS — F320 Major depressive disorder, single episode, mild: Secondary | ICD-10-CM | POA: Diagnosis not present

## 2017-11-14 DIAGNOSIS — F411 Generalized anxiety disorder: Secondary | ICD-10-CM | POA: Diagnosis not present

## 2017-11-14 NOTE — Telephone Encounter (Signed)
Patient made aware of results. Patient verbalizes understanding.   Narrative & Impression     NSR, sinus bradycardia, sinus tachycardia.  No arrhythmias.  No abnormalities correlated to her symptoms of palpitations and chest tightness.

## 2017-11-14 NOTE — Telephone Encounter (Signed)
-----   Message from Corky CraftsJayadeep S Varanasi, MD sent at 11/13/2017 11:41 AM EST ----- As per results noted.

## 2017-11-14 NOTE — Telephone Encounter (Signed)
Follow Up:    Returning your call from this morning,concerning her Monitor results.

## 2017-11-27 MED FILL — METOPROLOL SUCC ER 25 MG TA: 25 | 90 days supply | Qty: 90 | Fill #0

## 2017-11-29 MED FILL — FLUoxetine HCL 40 MG CAPS: 40 | 90 days supply | Qty: 180 | Fill #0

## 2017-12-11 DIAGNOSIS — K219 Gastro-esophageal reflux disease without esophagitis: Secondary | ICD-10-CM | POA: Diagnosis not present

## 2017-12-11 DIAGNOSIS — Z6828 Body mass index (BMI) 28.0-28.9, adult: Secondary | ICD-10-CM | POA: Diagnosis not present

## 2017-12-11 DIAGNOSIS — Z713 Dietary counseling and surveillance: Secondary | ICD-10-CM | POA: Diagnosis not present

## 2017-12-12 MED FILL — SAXENDA 18 MG/3 ML PEN: 18 | 6 days supply | Qty: 3 | Fill #0

## 2017-12-13 MED FILL — raNITIdine HCL 150 MG TABS: 150 | 30 days supply | Qty: 60 | Fill #0

## 2017-12-20 MED FILL — SAXENDA 18 MG/3 ML PEN: 18 | 6 days supply | Qty: 3 | Fill #1

## 2017-12-26 MED FILL — SAXENDA 18 MG/3 ML PEN: 18 | 6 days supply | Qty: 3 | Fill #2

## 2018-01-10 MED FILL — SAXENDA 18 MG/3 ML PEN: 18 | 6 days supply | Qty: 3 | Fill #3

## 2018-01-13 ENCOUNTER — Telehealth: Payer: 59 | Admitting: Family

## 2018-01-13 DIAGNOSIS — R6889 Other general symptoms and signs: Secondary | ICD-10-CM

## 2018-01-13 MED ORDER — OSELTAMIVIR PHOSPHATE 75 MG PO CAPS: 75.0000 mg | ORAL_CAPSULE | Freq: Two times a day (BID) | ORAL | 0 refills | Status: DC

## 2018-01-13 NOTE — Progress Notes (Signed)

## 2018-01-20 MED FILL — SAXENDA 18 MG/3 ML PEN: 18 | 6 days supply | Qty: 3 | Fill #4

## 2018-01-27 MED FILL — SAXENDA 18 MG/3 ML PEN: 18 | 6 days supply | Qty: 3 | Fill #5

## 2018-02-03 MED FILL — SAXENDA 18 MG/3 ML PEN: 18 | 6 days supply | Qty: 3 | Fill #6

## 2018-02-11 MED FILL — raNITIdine HCL 150 MG TABS: 150 | 30 days supply | Qty: 60 | Fill #1

## 2018-02-11 MED FILL — SAXENDA 18 MG/3 ML PEN: 18 | 6 days supply | Qty: 3 | Fill #7

## 2018-02-17 ENCOUNTER — Telehealth: Payer: 59 | Admitting: Family

## 2018-02-17 DIAGNOSIS — N390 Urinary tract infection, site not specified: Secondary | ICD-10-CM | POA: Diagnosis not present

## 2018-02-17 MED ORDER — NITROFURANTOIN MONOHYD MACRO 100 MG PO CAPS: 100.0000 mg | ORAL_CAPSULE | Freq: Two times a day (BID) | ORAL | 0 refills | Status: DC

## 2018-02-17 MED FILL — NITROFURANTOIN MONO-MCR 100: 100 | 5 days supply | Qty: 10 | Fill #0

## 2018-02-17 NOTE — Progress Notes (Signed)

## 2018-03-05 MED FILL — SAXENDA 18 MG/3 ML PEN: 18 | 6 days supply | Qty: 3 | Fill #8

## 2018-03-24 MED FILL — METOPROLOL SUCCINATE ER 25: 25 | 90 days supply | Qty: 90 | Fill #1

## 2018-05-14 DIAGNOSIS — H43392 Other vitreous opacities, left eye: Secondary | ICD-10-CM | POA: Diagnosis not present

## 2018-07-08 ENCOUNTER — Ambulatory Visit (INDEPENDENT_AMBULATORY_CARE_PROVIDER_SITE_OTHER): Payer: 59 | Admitting: Family Medicine

## 2018-07-08 ENCOUNTER — Other Ambulatory Visit: Payer: Self-pay

## 2018-07-08 ENCOUNTER — Encounter: Payer: Self-pay | Admitting: Family Medicine

## 2018-07-08 VITALS — BP 138/78 | HR 78 | Temp 99.1°F | Ht 60.0 in | Wt 163.2 lb

## 2018-07-08 DIAGNOSIS — Z23 Encounter for immunization: Secondary | ICD-10-CM

## 2018-07-08 DIAGNOSIS — R519 Headache, unspecified: Secondary | ICD-10-CM

## 2018-07-08 DIAGNOSIS — F411 Generalized anxiety disorder: Secondary | ICD-10-CM | POA: Diagnosis not present

## 2018-07-08 DIAGNOSIS — R202 Paresthesia of skin: Secondary | ICD-10-CM

## 2018-07-08 DIAGNOSIS — Z87898 Personal history of other specified conditions: Secondary | ICD-10-CM

## 2018-07-08 DIAGNOSIS — G4484 Primary exertional headache: Secondary | ICD-10-CM

## 2018-07-08 DIAGNOSIS — Z8679 Personal history of other diseases of the circulatory system: Secondary | ICD-10-CM | POA: Diagnosis not present

## 2018-07-08 DIAGNOSIS — R51 Headache: Secondary | ICD-10-CM

## 2018-07-08 MED ORDER — METOPROLOL SUCCINATE ER 25 MG PO TB24
25.0000 mg | ORAL_TABLET | Freq: Every day | ORAL | 1 refills | Status: DC
Start: 2018-07-08 — End: 2019-02-20

## 2018-07-08 MED ORDER — FLUOXETINE HCL 20 MG PO TABS: 20.0000 mg | ORAL_TABLET | Freq: Every day | ORAL | 1 refills | Status: DC

## 2018-07-08 MED FILL — FLUoxetine HCL 20 MG TABS: 20 | 90 days supply | Qty: 90 | Fill #0

## 2018-07-08 MED FILL — METOPROLOL SUCCINATE ER 25: 25 | 90 days supply | Qty: 90 | Fill #0

## 2018-07-08 NOTE — Patient Instructions (Addendum)
Restart Toprol and Prozac for possible anxiety component and blood pressure commended to your headaches.  Avoid exertional exercise for now until MRI is obtained.  Follow-up with me in the next few weeks, or after that MRI.  Let me know if there are questions in the meantime.  Thank you for coming in today.   General Headache Without Cause A headache is pain or discomfort felt around the head or neck area. There are many causes and types of headaches. In some cases, the cause may not be found. Follow these instructions at home: Managing pain  Take over-the-counter and prescription medicines only as told by your doctor.  Lie down in a dark, quiet room when you have a headache.  If directed, apply ice to the head and neck area: ? Put ice in a plastic bag. ? Place a towel between your skin and the bag. ? Leave the ice on for 20 minutes, 2-3 times per day.  Use a heating pad or hot shower to apply heat to the head and neck area as told by your doctor.  Keep lights dim if bright lights bother you or make your headaches worse. Eating and drinking  Eat meals on a regular schedule.  Lessen how much alcohol you drink.  Lessen how much caffeine you drink, or stop drinking caffeine. General instructions  Keep all follow-up visits as told by your doctor. This is important.  Keep a journal to find out if certain things bring on headaches. For example, write down: ? What you eat and drink. ? How much sleep you get. ? Any change to your diet or medicines.  Relax by getting a massage or doing other relaxing activities.  Lessen stress.  Sit up straight. Do not tighten (tense) your muscles.  Do not use tobacco products. This includes cigarettes, chewing tobacco, or e-cigarettes. If you need help quitting, ask your doctor.  Exercise regularly as told by your doctor.  Get enough sleep. This often means 7-9 hours of sleep. Contact a doctor if:  Your symptoms are not helped by  medicine.  You have a headache that feels different than the other headaches.  You feel sick to your stomach (nauseous) or you throw up (vomit).  You have a fever. Get help right away if:  Your headache becomes really bad.  You keep throwing up.  You have a stiff neck.  You have trouble seeing.  You have trouble speaking.  You have pain in the eye or ear.  Your muscles are weak or you lose muscle control.  You lose your balance or have trouble walking.  You feel like you will pass out (faint) or you pass out.  You have confusion. This information is not intended to replace advice given to you by your health care provider. Make sure you discuss any questions you have with your health care provider. Document Released: 09/04/2008 Document Revised: 05/03/2016 Document Reviewed: 03/21/2015 Elsevier Interactive Patient Education  2018 [employer]    IF you received an x-ray today, you will receive an invoice from [facility]. Please contact [facility] at [phone] with questions or concerns regarding your invoice.   IF you received labwork today, you will receive an invoice from [facility]. Please contact [facility] at [phone] with questions or concerns regarding your invoice.   Our billing staff will not be able to assist you with questions regarding bills from these companies.  You will be contacted with the lab results as soon as they are available.  The fastest way to get your results is to activate your My Chart account. Instructions are located on the last page of this paperwork. If you have not heard from [place] regarding the results in 2 weeks, please contact this office.

## 2018-07-08 NOTE — Progress Notes (Signed)
Subjective:  By signing my name below, I, Ashley Gardner, attest that this documentation has been prepared under the direction and in the presence of Ashley Agreste, MD Electronically Signed: Delton Gardner Medical Scribe 07/08/2018 at 6:36 PM   Patient ID: Ashley Gardner, female    DOB: 1981/02/27, 37 y.o.   MRN: [id number] Chief Complaint  Patient presents with  . Headache    left side. Going on for a month. comes and goes. it lasts a few min at a time when it comes.     HPI Ashley Gardner is a 37 y.o. female who presents to Primary Care at [facility] complaining of left sided headache. History of anxiety, depression, not currently on any medications. She reports that headache started about a month ago. She  Reported having left eye twitching in August of last year. She reports having episodic random tingling in left face and cheek around February to around March. But reports it has gone away now. She reports having a black floater in left eye, and going to see a ophthalmologist in June, where they told her floater is nothing to be concerned with. She reports severe headache with exercise and exertion, and lasting a few minutes and again when she walked into the bathroom at church. Recent headache was last week, she denies blurry vision, speech issues, neurologic symptoms or numbness in arms or legs.  She reports that she took herself of Prozac 40 mg back in March, and Toprol 25 mg once a day for blood pressure/palpitations. She does not record her blood pressure readings.  Note reviewed from Dr. Lavone Neri 11/14/17. She has chronic palpations, no changes recently. Patient reports father had an AVM of the brain, that he had repaired when he was 38 years old. She is unaware of aneurysm rupture in the family. Denies having any bloody noses. Patient believes she had a ct scan years ago, but not sure. She reports that current headaches are worse now than in the past. She reports that she emotional eats, and  that she does have some stress and anxiety but denies depression this time.   Additionally she notes that based on her records at the hospital she does need an updated MMR today.    Patient Active Problem List   Diagnosis Date Noted  . Family history of early CAD 09/30/2017  . Elevated blood pressure reading 08/06/2017  . Generalized anxiety disorder 08/06/2017  . Insomnia 08/06/2017  . Current severe episode of major depressive disorder without psychotic features without prior episode ([place]) 08/06/2017   Past Medical History:  Diagnosis Date  . Anxiety    Decreased concentration, Excessive worry, insomnia, irritability, nervous/anxious behavior,obessions(food) restlessness.  . Depression    Feelings of hopelessness, feeling of worthlessness  . Hyperlipidemia   . Hypersomnia   . Palpitations   . Restlessness    Past Surgical History:  Procedure Laterality Date  . LEFT MASS BIOPSY     Benign appearing fragments ofbone and fibrous tissue, deferential    Allergies  Allergen Reactions  . Penicillins Hives   Prior to Admission medications   Medication Sig Start Date End Date Taking? Authorizing Provider  Levonorgestrel (LILETTA, 52 MG,) 19.5 MCG/DAY IUD IUD by Intrauterine route.    [provider]   Social History   Socioeconomic History  . Marital status: Married    Spouse name: Not on file  . Number of children: Not on file  . Years of education: Not on file  .  Highest education level: Not on file  Occupational History  . Not on file  Social Needs  . Financial resource strain: Not on file  . Food insecurity:    Worry: Not on file    Inability: Not on file  . Transportation needs:    Medical: Not on file    Non-medical: Not on file  Tobacco Use  . Smoking status: Never Smoker  . Smokeless tobacco: Never Used  Substance and Sexual Activity  . Alcohol use: Yes  . Drug use: No  . Sexual activity: Not on file  Lifestyle  . Physical activity:    Days  per week: Not on file    Minutes per session: Not on file  . Stress: Not on file  Relationships  . Social connections:    Talks on phone: Not on file    Gets together: Not on file    Attends religious service: Not on file    Active member of club or organization: Not on file    Attends meetings of clubs or organizations: Not on file    Relationship status: Not on file  . Intimate partner violence:    Fear of current or ex partner: Not on file    Emotionally abused: Not on file    Physically abused: Not on file    Forced sexual activity: Not on file  Other Topics Concern  . Not on file  Social History Narrative  . Not on file   Review of Systems      Objective:   Physical Exam  Constitutional: She is oriented to person, place, and time. She appears well-developed and well-nourished.  Neck: Normal range of motion.  Cardiovascular: Normal rate, regular rhythm, normal heart sounds and intact distal pulses.  Pulmonary/Chest: Effort normal and breath sounds normal.  Musculoskeletal: Normal range of motion.  Neurological: She is alert and oriented to person, place, and time.  No pronator drift, normal heel slide. Normal finger to nose. She is negative for romberg, she has no focal weakness. PERRL nystagmus.   Psychiatric: She has a normal mood and affect. Her behavior is normal. Judgment and thought content normal.   Vitals:   07/08/18 1431 07/08/18 1436  BP: (!) 155/88 138/78  Pulse: 78   Temp: 99.1 F (37.3 C)   TempSrc: Oral   Weight: 163 lb 3.2 oz (74 kg)   Height: 5' (1.524 m)    I spent 40 minutes minutes face to face with the patient and more than 50% of that time was spent in counseling and/or coordination of care.    Assessment & Plan:    Ashley Gardner is a 37 y.o. female Tingling sensation in face Nonintractable episodic headache, unspecified headache type Primary exertional headache - Plan: MR MRA HEAD WO CONTRAST  -Exertional headache, now with worsening  symptoms.  Family history of arteriovenous malformation.  Nonfocal neurologic exam at present, but will schedule MRI MRA to rule out aneurysm/AVM.  Avoid exertional exercise until that study has been performed.  History of palpitations - Plan: metoprolol succinate (TOPROL-XL) 25 MG 24 hr tablet  -Persistent symptoms, start low-dose Toprol 25 mg daily, borderline blood pressure as well.  History of hypertension  -As above restart Toprol  Anxiety state - Plan: FLUoxetine (PROZAC) 20 MG tablet  -Some increased anxiety symptoms with generalized anxiety disorder, restart Prozac at 20 mg daily, which may be sufficient at this time.  Option of increasing back to 40 mg given.  Need for MMR  vaccine - Plan: MMR vaccine subcutaneous given   Meds ordered this encounter  Medications  . metoprolol succinate (TOPROL-XL) 25 MG 24 hr tablet    Sig: Take 1 tablet (25 mg total) by mouth daily.    Dispense:  90 tablet    Refill:  1  . FLUoxetine (PROZAC) 20 MG tablet    Sig: Take 1 tablet (20 mg total) by mouth daily.    Dispense:  90 tablet    Refill:  1   Patient Instructions   Restart Toprol and Prozac for possible anxiety component and blood pressure commended to your headaches.  Avoid exertional exercise for now until MRI is obtained.  Follow-up with me in the next few weeks, or after that MRI.  Let me know if there are questions in the meantime.  Thank you for coming in today.   General Headache Without Cause A headache is pain or discomfort felt around the head or neck area. There are many causes and types of headaches. In some cases, the cause may not be found. Follow these instructions at home: Managing pain  Take over-the-counter and prescription medicines only as told by your doctor.  Lie down in a dark, quiet room when you have a headache.  If directed, apply ice to the head and neck area: ? Put ice in a plastic bag. ? Place a towel between your skin and the bag. ? Leave the ice on  for 20 minutes, 2-3 times per day.  Use a heating pad or hot shower to apply heat to the head and neck area as told by your doctor.  Keep lights dim if bright lights bother you or make your headaches worse. Eating and drinking  Eat meals on a regular schedule.  Lessen how much alcohol you drink.  Lessen how much caffeine you drink, or stop drinking caffeine. General instructions  Keep all follow-up visits as told by your doctor. This is important.  Keep a journal to find out if certain things bring on headaches. For example, write down: ? What you eat and drink. ? How much sleep you get. ? Any change to your diet or medicines.  Relax by getting a massage or doing other relaxing activities.  Lessen stress.  Sit up straight. Do not tighten (tense) your muscles.  Do not use tobacco products. This includes cigarettes, chewing tobacco, or e-cigarettes. If you need help quitting, ask your doctor.  Exercise regularly as told by your doctor.  Get enough sleep. This often means 7-9 hours of sleep. Contact a doctor if:  Your symptoms are not helped by medicine.  You have a headache that feels different than the other headaches.  You feel sick to your stomach (nauseous) or you throw up (vomit).  You have a fever. Get help right away if:  Your headache becomes really bad.  You keep throwing up.  You have a stiff neck.  You have trouble seeing.  You have trouble speaking.  You have pain in the eye or ear.  Your muscles are weak or you lose muscle control.  You lose your balance or have trouble walking.  You feel like you will pass out (faint) or you pass out.  You have confusion. This information is not intended to replace advice given to you by your health care provider. Make sure you discuss any questions you have with your health care provider. Document Released: 09/04/2008 Document Revised: 05/03/2016 Document Reviewed: 03/21/2015 Elsevier Interactive Patient  Education  2018 Elsevier  Inc.    IF you received an x-ray today, you will receive an invoice from [facility]. Please contact [facility] at [phone] with questions or concerns regarding your invoice.   IF you received labwork today, you will receive an invoice from [facility]. Please contact [facility] at [phone] with questions or concerns regarding your invoice.   Our billing staff will not be able to assist you with questions regarding bills from these companies.  You will be contacted with the lab results as soon as they are available. The fastest way to get your results is to activate your My Chart account. Instructions are located on the last page of this paperwork. If you have not heard from [place] regarding the results in 2 weeks, please contact this office.       I personally performed the services described in this documentation, which was scribed in my presence. The recorded information has been reviewed and considered for accuracy and completeness, addended by me as needed, and agree with information above.  Signed,   Merri Ray, MD [facility] at [facility].  07/13/18 9:26 AM

## 2018-07-22 ENCOUNTER — Encounter: Payer: Self-pay | Admitting: Family Medicine

## 2018-07-23 ENCOUNTER — Telehealth: Payer: 59 | Admitting: Family Medicine

## 2018-07-23 ENCOUNTER — Ambulatory Visit: Payer: Self-pay | Admitting: Nurse Practitioner

## 2018-07-23 VITALS — BP 100/75 | HR 75 | Temp 98.3°F | Resp 16 | Wt 163.2 lb

## 2018-07-23 DIAGNOSIS — R0982 Postnasal drip: Secondary | ICD-10-CM

## 2018-07-23 DIAGNOSIS — J029 Acute pharyngitis, unspecified: Secondary | ICD-10-CM

## 2018-07-23 MED ORDER — MONTELUKAST SODIUM 10 MG PO TABS: 10.0000 mg | ORAL_TABLET | Freq: Every day | ORAL | 0 refills | Status: DC

## 2018-07-23 NOTE — Progress Notes (Signed)
Based on what you shared with me it looks like you have a condition that should be evaluated in a face to face office visit. It is important to have your symptoms evaluated in person especially if you believe that you may have strep throat based on your previous personal experience with sore throat. Apologies for any inconvenience this may cause, hoping you feel better soon.  NOTE: If you entered your credit card information for this eVisit, you will not be charged. You may see a "hold" on your card for the $30 but that hold will drop off and you will not have a charge processed.  If you are having a true medical emergency please call 911.  If you need an urgent face to face visit, [place] has four urgent care centers for your convenience.  If you need care fast and have a high deductible or no insurance consider:   [email or web address] to reserve your spot online an avoid wait times  [facility] [address], [address] [place], [place] [address] 8 am to 8 pm Monday-Friday 10 am to 4 pm Saturday-Sunday *Across the street from [employer]  [facility]  [address] [place], [address] 8 am to 5 pm Monday-Friday * In the [facility] on the [facility]   The following sites will take your  insurance:  . [facility]  [phone] Get Driving Directions Find a Provider at this Location  [address] [place], [place] [address] . 10 am to 8 pm Monday-Friday . 12 pm to 8 pm Saturday-Sunday   . [facility]  [phone] Get Driving Directions Find a Provider at this Location  [address] [place], [place] [address] . 8 am to 8 pm Monday-Friday . 9 am to 6 pm Saturday . 11 am to 6 pm Sunday   . [facility] at [facility] [place]  [phone] Get Driving Directions  [address] [place], [place] [address] . 8 am to 8 pm  Monday-Friday . 8 am to 4 pm Saturday-Sunday   Your e-visit answers were reviewed by a board certified advanced clinical practitioner to complete your personal care plan.  Thank you for using e-Visits.

## 2018-07-23 NOTE — Progress Notes (Signed)
Subjective:     Ashley Gardner is a 37 y.o. female who presents for evaluation of sore throat. Associated symptoms include post nasal drip and headache. Onset of symptoms was 1 day ago, and have been gradually worsening since that time. She is drinking moderate amounts of fluids. She has not had a recent close exposure to someone with proven streptococcal pharyngitis.  The patient states that she was around a mother whose children were diagnosed with a sore throat and hand-foot-and-mouth.  Patient states she has a history of recurrent sinusitis, but has not taken anything for her symptoms today.  The following portions of the patient's history were reviewed and updated as appropriate: allergies, current medications and past medical history.  Review of Systems Constitutional: negative Eyes: negative Ears, nose, mouth, throat, and face: positive for sore throat, negative for ear drainage, earaches and nasal congestion Respiratory: negative Cardiovascular: negative Gastrointestinal: negative Neurological: positive for headaches, negative for dizziness, paresthesia, seizures, speech problems, tremors, vertigo and weakness Allergic/Immunologic: positive for hay fever    Objective:    BP 100/75 (BP Location: Left Arm, Patient Position: Sitting, Cuff Size: Normal)   Pulse 75   Temp 98.3 F (36.8 C) (Oral)   Resp 16   Wt 163 lb 3.2 oz (74 kg)   SpO2 98%   BMI 31.87 kg/m  General appearance: alert, cooperative and no distress Head: Normocephalic, without obvious abnormality, atraumatic Eyes: conjunctivae/corneas clear. PERRL, EOM's intact. Fundi benign. Ears: normal TM's and external ear canals both ears Nose: Nares normal. Septum midline. Mucosa normal. No drainage or sinus tenderness. Throat: abnormal findings: mild oropharyngeal erythema, no exudate Lungs: clear to auscultation bilaterally Heart: regular rate and rhythm, S1, S2 normal, no murmur, click, rub or gallop Abdomen: soft,  non-tender; bowel sounds normal; no masses,  no organomegaly Pulses: 2+ and symmetric Skin: Skin color, texture, turgor normal. No rashes or lesions Lymph nodes: cervical and submandibular nodes normal Neurologic: Grossly normal  Laboratory Strep test not done. Results:not applicable.    Assessment:    Acute pharyngitis, likely  Viral pharyngitis. and Post Nasal Drip   Plan:   Exam findings, diagnosis etiology and medication use and indications reviewed with patient. Follow- Up and discharge instructions provided. No emergent/urgent issues found on exam.  Patient verbalized understanding of information provided and agrees with plan of care (POC), all questions answered.  1. Viral pharyngitis  - montelukast (SINGULAIR) 10 MG tablet; Take 1 tablet (10 mg total) by mouth at bedtime.  Dispense: 30 tablet; Refill: 0 -Singulair as prescribed. -Warm saltwater gargles 3-4 times daily until symptoms improve. -Ibuprofen 800mg  every 8 hours for 3 days. -Increase fluids. -Follow up in 7-10 days if symptoms do not improve or sooner if symptoms worsen.  2. Postnasal drip  - montelukast (SINGULAIR) 10 MG tablet; Take 1 tablet (10 mg total) by mouth at bedtime.  Dispense: 30 tablet; Refill: 0  -Singulair as prescribed. -Warm saltwater gargles 3-4 times daily until symptoms improve. -Ibuprofen 800mg  every 8 hours for 3 days. -Increase fluids. -Follow up in 7-10 days if symptoms do not improve or sooner if symptoms worsen.

## 2018-07-23 NOTE — Patient Instructions (Signed)
Pharyngitis -Singulair as prescribed. -Warm saltwater gargles 3-4 times daily until symptoms improve. -Ibuprofen 800mg  every 8 hours for 3 days. -Increase fluids. -Follow up in 7-10 days if symptoms do not improve or sooner if symptoms worsen.  Pharyngitis is redness, pain, and swelling (inflammation) of the throat (pharynx). It is a very common cause of sore throat. Pharyngitis can be caused by a bacteria, but it is usually caused by a virus. Most cases of pharyngitis get better on their own without treatment. What are the causes? This condition may be caused by:  Infection by viruses (viral). Viral pharyngitis spreads from person to person (is contagious) through coughing, sneezing, and sharing of personal items or utensils such as cups, forks, spoons, and toothbrushes.  Infection by bacteria (bacterial). Bacterial pharyngitis may be spread by touching the nose or face after coming in contact with the bacteria, or through more intimate contact, such as kissing.  Allergies. Allergies can cause buildup of mucus in the throat (post-nasal drip), leading to inflammation and irritation. Allergies can also cause blocked nasal passages, forcing breathing through the mouth, which dries and irritates the throat.  What increases the risk? You are more likely to develop this condition if:  You are 645-38 years old.  You are exposed to crowded environments such as daycare, school, or dormitory living.  You live in a cold climate.  You have a weakened disease-fighting (immune) system.  What are the signs or symptoms? Symptoms of this condition vary by the cause (viral, bacterial, or allergies) and can include:  Sore throat.  Fatigue.  Low-grade fever.  Headache.  Joint pain and muscle aches.  Skin rashes.  Swollen glands in the throat (lymph nodes).  Plaque-like film on the throat or tonsils. This is often a symptom of bacterial pharyngitis.  Vomiting.  Stuffy nose (nasal  congestion).  Cough.  Red, itchy eyes (conjunctivitis).  Loss of appetite.  How is this diagnosed? This condition is often diagnosed based on your medical history and a physical exam. Your health care provider will ask you questions about your illness and your symptoms. A swab of your throat may be done to check for bacteria (rapid strep test). Other lab tests may also be done, depending on the suspected cause, but these are rare. How is this treated? This condition usually gets better in 3-4 days without medicine. Bacterial pharyngitis may be treated with antibiotic medicines. Follow these instructions at home:  Take over-the-counter and prescription medicines only as told by your health care provider. ? If you were prescribed an antibiotic medicine, take it as told by your health care provider. Do not stop taking the antibiotic even if you start to feel better. ? Do not give children aspirin because of the association with Reye syndrome.  Drink enough water and fluids to keep your urine clear or pale yellow.  Get a lot of rest.  Gargle with a salt-water mixture 3-4 times a day or as needed. To make a salt-water mixture, completely dissolve -1 tsp of salt in 1 cup of warm water.  If your health care provider approves, you may use throat lozenges or sprays to soothe your throat. Contact a health care provider if:  You have large, tender lumps in your neck.  You have a rash.  You cough up green, yellow-brown, or bloody spit. Get help right away if:  Your neck becomes stiff.  You drool or are unable to swallow liquids.  You cannot drink or take medicines without vomiting.  You have severe pain that does not go away, even after you take medicine.  You have trouble breathing, and it is not caused by a stuffy nose.  You have new pain and swelling in your joints such as the knees, ankles, wrists, or elbows. Summary  Pharyngitis is redness, pain, and swelling (inflammation)  of the throat (pharynx).  While pharyngitis can be caused by a bacteria, the most common causes are viral.  Most cases of pharyngitis get better on their own without treatment.  Bacterial pharyngitis is treated with antibiotic medicines. This information is not intended to replace advice given to you by your health care provider. Make sure you discuss any questions you have with your health care provider. Document Released: 11/26/2005 Document Revised: 01/01/2017 Document Reviewed: 01/01/2017 Elsevier Interactive Patient Education  2018 Elsevier Inc.  Postnasal Drip Postnasal drip is the feeling of mucus going down the back of your throat. Mucus is a slimy substance that moistens and cleans your nose and throat, as well as the air pockets in face bones near your forehead and cheeks (sinuses). Small amounts of mucus pass from your nose and sinuses down the back of your throat all the time. This is normal. When you produce too much mucus or the mucus gets too thick, you can feel it. Some common causes of postnasal drip include:  Having more mucus because of: ? A cold or the flu. ? Allergies. ? Cold air. ? Certain medicines.  Having more mucus that is thicker because of: ? A sinus or nasal infection. ? Dry air. ? A food allergy.  Follow these instructions at home: Relieving discomfort  Gargle with a salt-water mixture 3-4 times a day or as needed. To make a salt-water mixture, completely dissolve -1 tsp of salt in 1 cup of warm water.  If the air in your home is dry, use a humidifier to add moisture to the air.  Use a saline spray or container (neti pot) to flush out the nose (nasal irrigation). These methods can help clear away mucus and keep the nasal passages moist. General instructions  Take over-the-counter and prescription medicines only as told by your health care provider.  Follow instructions from your health care provider about eating or drinking restrictions. You may  need to avoid caffeine.  Avoid things that you know you are allergic to (allergens), like dust, mold, pollen, pets, or certain foods.  Drink enough fluid to keep your urine pale yellow.  Keep all follow-up visits as told by your health care provider. This is important. Contact a health care provider if:  You have a fever.  You have a sore throat.  You have difficulty swallowing.  You have headache.  You have sinus pain.  You have a cough that does not go away.  The mucus from your nose becomes thick and is green or yellow in color.  You have cold or flu symptoms that last more than 10 days. Summary  Postnasal drip is the feeling of mucus going down the back of your throat.  If your health care provider approves, use nasal irrigation or a nasal spray 2?4 times a day.  Avoid things that you know you are allergic to (allergens), like dust, mold, pollen, pets, or certain foods. This information is not intended to replace advice given to you by your health care provider. Make sure you discuss any questions you have with your health care provider. Document Released: 03/11/2017 Document Revised: 03/11/2017 Document Reviewed: 03/11/2017 Elsevier Interactive  Patient Education  2018 Elsevier Inc.  

## 2018-07-29 ENCOUNTER — Encounter: Payer: Self-pay | Admitting: Family Medicine

## 2018-07-29 ENCOUNTER — Ambulatory Visit (INDEPENDENT_AMBULATORY_CARE_PROVIDER_SITE_OTHER): Payer: 59 | Admitting: Family Medicine

## 2018-07-29 ENCOUNTER — Other Ambulatory Visit: Payer: Self-pay

## 2018-07-29 VITALS — BP 127/70 | HR 66 | Temp 98.6°F | Ht 60.0 in | Wt 163.4 lb

## 2018-07-29 DIAGNOSIS — F411 Generalized anxiety disorder: Secondary | ICD-10-CM

## 2018-07-29 DIAGNOSIS — G4484 Primary exertional headache: Secondary | ICD-10-CM | POA: Diagnosis not present

## 2018-07-29 DIAGNOSIS — R002 Palpitations: Secondary | ICD-10-CM | POA: Diagnosis not present

## 2018-07-29 NOTE — Patient Instructions (Addendum)
  Once we see results of the neuroimaging we can discuss if other workup needed.   Ok to continue same dose of toprol for now, and fluoxetine.   If higher dose of prozac needed - can increase to 2 pills per day. Let me know if you end up using the higher dose. Let me know if there are questions in the meantime.   If you have lab work done today you will be contacted with your lab results within the next 2 weeks.  If you have not heard from us then please contact us. The fastest way to get your results is to register for My Chart.   IF you received an x-ray today, you will receive an invoice from [facility]. Please contact [facility] at [phone] with questions or concerns regarding your invoice.   IF you received labwork today, you will receive an invoice from [facility]. Please contact [facility] at [phone] with questions or concerns regarding your invoice.   Our billing staff will not be able to assist you with questions regarding bills from these companies.  You will be contacted with the lab results as soon as they are available. The fastest way to get your results is to activate your My Chart account. Instructions are located on the last page of this paperwork. If you have not heard from us regarding the results in 2 weeks, please contact this office.

## 2018-07-29 NOTE — Progress Notes (Signed)
Subjective:  By signing my name below, I, Ashley Gardner, attest that this documentation has been prepared under the direction and in the presence of Shade FloodJeffrey R Cephas Revard, MD Electronically Signed: Charline BillsEssence Gardner, ED Scribe 07/29/2018 at 8:52 AM.   Patient ID: Ashley Gardner, female    DOB: 1981/03/19, 37 y.o.   MRN: [id number]  Chief Complaint  Patient presents with  . Headache    1 month f/u with new meds   HPI Ashley Gardner is a 37 y.o. female who presents to Primary Care at [facility] for f/u of HA. See last OV. Discussed poss neuro imaging but subsequent email she decided to defer testing as symptoms improved. H/o migraines and anxiety, restarted on Prozac 20 mg qd and toprol 25 mg qd last OV. Also has a h/o episodic tingling of face and visions symptoms. When discussed last visit, she had been evaluated by optho without concerns. Did notice exertional HA and family h/o AV malformation, which was reason for MRI/MRA to be ordered. H/o palpitations, toprol restarted as above.  Pt states that she has only exercised once since last visit but did not get a HA. Reports she did have a HA last wk which she attributes to sore throat and sinus pressure, but denies typical HA. She plans to have neuro imaging done. States she has had less heart palpitations since starting toprol. Denies lightheadedness, dizziness. Also reports that her mood has improved overall since restarting Prozac. Pt is a nonsmoker.  Family Hx Father: h/o 2 MIs and stroke before the age of 38 Mother: passed from heart disease 3 yrs ago, h/o tobacco use  Patient Active Problem List   Diagnosis Date Noted  . Family history of early CAD 09/30/2017  . Elevated blood pressure reading 08/06/2017  . Generalized anxiety disorder 08/06/2017  . Insomnia 08/06/2017  . Current severe episode of major depressive disorder without psychotic features without prior episode (HCC) 08/06/2017   Past Medical History:  Diagnosis Date  . Anxiety      Decreased concentration, Excessive worry, insomnia, irritability, nervous/anxious behavior,obessions(food) restlessness.  . Depression    Feelings of hopelessness, feeling of worthlessness  . Hyperlipidemia   . Hypersomnia   . Palpitations   . Restlessness    Past Surgical History:  Procedure Laterality Date  . LEFT MASS BIOPSY     Benign appearing fragments ofbone and fibrous tissue, deferential    Allergies  Allergen Reactions  . Penicillins Hives   Prior to Admission medications   Medication Sig Start Date End Date Taking? Authorizing Provider  FLUoxetine (PROZAC) 20 MG tablet Take 1 tablet (20 mg total) by mouth daily. 07/08/18   Shade FloodGreene, Geordie Nooney R, MD  Levonorgestrel (LILETTA, 52 MG,) 19.5 MCG/DAY IUD IUD by Intrauterine route.    [provider]  metoprolol succinate (TOPROL-XL) 25 MG 24 hr tablet Take 1 tablet (25 mg total) by mouth daily. 07/08/18   Shade FloodGreene, Jace Dowe R, MD  montelukast (SINGULAIR) 10 MG tablet Take 1 tablet (10 mg total) by mouth at bedtime. 07/23/18 08/22/18  Benay PikeLeath, Christie Janell, NP   Social History   Socioeconomic History  . Marital status: Married    Spouse name: Not on file  . Number of children: Not on file  . Years of education: Not on file  . Highest education level: Not on file  Occupational History  . Not on file  Social Needs  . Financial resource strain: Not on file  . Food insecurity:    Worry: Not  on file    Inability: Not on file  . Transportation needs:    Medical: Not on file    Non-medical: Not on file  Tobacco Use  . Smoking status: Never Smoker  . Smokeless tobacco: Never Used  Substance and Sexual Activity  . Alcohol use: Yes  . Drug use: No  . Sexual activity: Not on file  Lifestyle  . Physical activity:    Days per week: Not on file    Minutes per session: Not on file  . Stress: Not on file  Relationships  . Social connections:    Talks on phone: Not on file    Gets together: Not on file    Attends  religious service: Not on file    Active member of club or organization: Not on file    Attends meetings of clubs or organizations: Not on file    Relationship status: Not on file  . Intimate partner violence:    Fear of current or ex partner: Not on file    Emotionally abused: Not on file    Physically abused: Not on file    Forced sexual activity: Not on file  Other Topics Concern  . Not on file  Social History Narrative  . Not on file   Review of Systems  Cardiovascular: Negative for palpitations (improved).  Neurological: Positive for headaches (rare, intermittent). Negative for dizziness and light-headedness.  Psychiatric/Behavioral: The patient is not nervous/anxious (improved).       Objective:   Physical Exam  Constitutional: She is oriented to person, place, and time. She appears well-developed and well-nourished. No distress.  HENT:  Head: Normocephalic and atraumatic.  Eyes: Conjunctivae and EOM are normal.  Neck: Neck supple. No tracheal deviation present.  Cardiovascular: Normal rate.  Pulmonary/Chest: Effort normal. No respiratory distress.  Musculoskeletal: Normal range of motion.  Neurological: She is alert and oriented to person, place, and time.  Skin: Skin is warm and dry.  Psychiatric: She has a normal mood and affect. Her behavior is normal.  Nursing note and vitals reviewed.  Vitals:   07/29/18 0823  BP: 127/70  Pulse: 66  Temp: 98.6 F (37 C)  TempSrc: Oral  SpO2: 93%  Weight: 163 lb 6.4 oz (74.1 kg)  Height: 5' (1.524 m)      Assessment & Plan:  Ashley Gardner is a 37 y.o. female Palpitations  -  Stable, tolerating current regimen. Continue same dose toprol.   Primary exertional headache  - still plans on neuroimaging due to family history of AVM and exertional HA, but improved back on prozac and toprol. May have anxiety/migraine component. rtc precautions and recheck in 5 months.   Generalized anxiety disorder  - improved. Continue  prozac same dose, but option of 40mg  dosing if worsening anxiety.   recheck 5 mo.   No orders of the defined types were placed in this encounter.  Patient Instructions    Once we see results of the neuroimaging we can discuss if other workup needed.   Ok to continue same dose of toprol for now, and fluoxetine.   If higher dose of prozac needed - can increase to 2 pills per day. Let me know if you end up using the higher dose. Let me know if there are questions in the meantime.   If you have lab work done today you will be contacted with your lab results within the next 2 weeks.  If you have not heard from [place] then please  contact us. The fastest way to get your results is to register for My Chart.   IF you received an x-ray today, you will receive an invoice from [facility]. Please contact [facility] at [phone] with questions or concerns regarding your invoice.   IF you received labwork today, you will receive an invoice from [facility]. Please contact [facility] at [phone] with questions or concerns regarding your invoice.   Our billing staff will not be able to assist you with questions regarding bills from these companies.  You will be contacted with the lab results as soon as they are available. The fastest way to get your results is to activate your My Chart account. Instructions are located on the last page of this paperwork. If you have not heard from us regarding the results in 2 weeks, please contact this office.       I personally performed the services described in this documentation, which was scribed in my presence. The recorded information has been reviewed and considered for accuracy and completeness, addended by me as needed, and agree with information above.  Signed,   Meredith StaggersJeffrey Aailyah Dunbar, MD Primary Care at [facility].  07/29/18 9:08 AM

## 2018-08-01 ENCOUNTER — Other Ambulatory Visit: Payer: 59

## 2018-09-09 MED FILL — raNITIdine HCL 150 MG TABS: 150 | 30 days supply | Qty: 60 | Fill #2

## 2018-10-18 ENCOUNTER — Telehealth: Payer: 59 | Admitting: Nurse Practitioner

## 2018-10-18 DIAGNOSIS — N3000 Acute cystitis without hematuria: Secondary | ICD-10-CM | POA: Diagnosis not present

## 2018-10-18 MED ORDER — NITROFURANTOIN MONOHYD MACRO 100 MG PO CAPS: 100.0000 mg | ORAL_CAPSULE | Freq: Two times a day (BID) | ORAL | 0 refills | Status: DC

## 2018-10-18 NOTE — Progress Notes (Signed)

## 2018-11-11 ENCOUNTER — Telehealth: Payer: Self-pay | Admitting: Family Medicine

## 2018-11-11 MED FILL — METOPROLOL SUCCINATE ER 25: 25 | 90 days supply | Qty: 90 | Fill #1

## 2018-11-11 MED FILL — FLUoxetine HCL 20 MG TABS: 20 | 30 days supply | Qty: 30 | Fill #1

## 2018-11-11 NOTE — Telephone Encounter (Signed)
See request °

## 2018-11-11 NOTE — Telephone Encounter (Signed)
Copied from CRM [id number]. Topic: Quick Communication - Rx Refill/Question >> Nov 11, 2018  3:46 PM Wyonia HoughJohnson, Chaz E wrote: Medication: FLUoxetine (PROZAC) 20 MG tablet  Pt and pharmacy is requesting this medication in the capsule form instead of the tablet form due to pricing with insurance   Has the patient contacted their pharmacy? Yes.  (Pharmacy called)  Preferred Pharmacy (with phone number or street name): [facility] [place] [address] [phone] (Phone) [phone] (Fax)    Agent: Please be advised that RX refills may take up to 3 business days. We ask that you follow-up with your pharmacy.

## 2018-11-12 MED ORDER — FLUOXETINE HCL 20 MG PO CAPS: 20.0000 mg | ORAL_CAPSULE | Freq: Every day | ORAL | 1 refills | Status: DC

## 2018-11-12 NOTE — Telephone Encounter (Signed)
Please advise 

## 2018-11-12 NOTE — Telephone Encounter (Signed)
Done

## 2018-11-18 ENCOUNTER — Encounter: Payer: Self-pay | Admitting: Family Medicine

## 2018-11-20 ENCOUNTER — Encounter: Payer: Self-pay | Admitting: Family Medicine

## 2018-12-01 ENCOUNTER — Encounter: Payer: Self-pay | Admitting: Family Medicine

## 2018-12-02 MED ORDER — FLUOXETINE HCL 20 MG PO CAPS: 40.0000 mg | ORAL_CAPSULE | Freq: Every day | ORAL | 1 refills | Status: DC

## 2018-12-02 MED FILL — FLUoxetine HCL 20 MG CAPS: 20 | 90 days supply | Qty: 180 | Fill #0

## 2018-12-05 MED FILL — FLUoxetine HCL 20 MG TABS: 20 | 30 days supply | Qty: 30 | Fill #2

## 2018-12-16 ENCOUNTER — Encounter: Payer: Self-pay | Admitting: Physician Assistant

## 2018-12-16 ENCOUNTER — Ambulatory Visit (INDEPENDENT_AMBULATORY_CARE_PROVIDER_SITE_OTHER): Payer: Self-pay | Admitting: Physician Assistant

## 2018-12-16 VITALS — BP 120/78 | HR 124 | Temp 99.5°F | Resp 18 | Wt 167.0 lb

## 2018-12-16 DIAGNOSIS — J101 Influenza due to other identified influenza virus with other respiratory manifestations: Secondary | ICD-10-CM

## 2018-12-16 DIAGNOSIS — R6889 Other general symptoms and signs: Secondary | ICD-10-CM

## 2018-12-16 DIAGNOSIS — J029 Acute pharyngitis, unspecified: Secondary | ICD-10-CM

## 2018-12-16 LAB — POCT RAPID STREP A (OFFICE): Rapid Strep A Screen: NEGATIVE

## 2018-12-16 LAB — POCT INFLUENZA A/B
Influenza A, POC: POSITIVE — AB
Influenza B, POC: NEGATIVE

## 2018-12-16 MED ORDER — BENZONATATE 100 MG PO CAPS: 100.0000 mg | ORAL_CAPSULE | Freq: Three times a day (TID) | ORAL | 0 refills | Status: DC | PRN

## 2018-12-16 MED ORDER — AZELASTINE HCL 0.1 % NA SOLN: 1.0000 | Freq: Two times a day (BID) | NASAL | 0 refills | Status: DC

## 2018-12-16 MED ORDER — OSELTAMIVIR PHOSPHATE 75 MG PO CAPS: 75.0000 mg | ORAL_CAPSULE | Freq: Two times a day (BID) | ORAL | 0 refills | Status: DC

## 2018-12-16 MED FILL — OSELTAMIVIR PHOSPHATE 75 MG: 75 | 5 days supply | Qty: 10 | Fill #0

## 2018-12-16 MED FILL — BENZONATATE 100 MG CAP: 100 | 6 days supply | Qty: 40 | Fill #0

## 2018-12-16 MED FILL — AZELASTINE HCL 137 MCG SPRY: 0.1 | 50 days supply | Qty: 30 | Fill #0

## 2018-12-16 NOTE — Patient Instructions (Addendum)
Influenza, Adult  You have tested positive for the flu, you are contagious until you are fever free for 48 hours without using tylenol or ibuprofen.Please stay out of work until you are no longer contagious. 3 major complications after the flu are ear infections,  Pneumonia,  and sinus infections. Please be aware of this and if you are not any better in 7-10 days or you develop worsening cough or sinus pressure, seek care at our clinic or the ED. Continue to wash your hands and wear a mask daily especially around other people.   I have sent in Tamiflu Use tessalon perles for cough Use astelin nasal spray for congestion. Use coricidin cough syrup   Influenza is also called "the flu." It is an infection in the lungs, nose, and throat (respiratory tract). It is caused by a virus. The flu causes symptoms that are similar to symptoms of a cold. It also causes a high fever and body aches. The flu spreads easily from person to person (is contagious). Getting a flu shot (influenza vaccination) every year is the best way to prevent the flu. What are the causes? This condition is caused by the influenza virus. You can get the virus by:  Breathing in droplets that are in the air from the cough or sneeze of a person who has the virus.  Touching something that has the virus on it (is contaminated) and then touching your mouth, nose, or eyes. What increases the risk? Certain things may make you more likely to get the flu. These include:  Not washing your hands often.  Having close contact with many people during cold and flu season.  Touching your mouth, eyes, or nose without first washing your hands.  Not getting a flu shot every year. You may have a higher risk for the flu, along with serious problems such as a lung infection (pneumonia), if you:  Are older than 65.  Are pregnant.  Have a weakened disease-fighting system (immune system) because of a disease or taking certain medicines.  Have a  long-term (chronic) illness, such as: ? Heart, kidney, or lung disease. ? Diabetes. ? Asthma.  Have a liver disorder.  Are very overweight (morbidly obese).  Have anemia. This is a condition that affects your red blood cells. What are the signs or symptoms? Symptoms usually begin suddenly and last 4-14 days. They may include:  Fever and chills.  Headaches, body aches, or muscle aches.  Sore throat.  Cough.  Runny or stuffy (congested) nose.  Chest discomfort.  Not wanting to eat as much as normal (poor appetite).  Weakness or feeling tired (fatigue).  Dizziness.  Feeling sick to your stomach (nauseous) or throwing up (vomiting). How is this treated? If the flu is found early, you can be treated with medicine that can help reduce how bad the illness is and how long it lasts (antiviral medicine). This may be given by mouth (orally) or through an IV tube. Taking care of yourself at home can help your symptoms get better. Your doctor may suggest:  Taking over-the-counter medicines.  Drinking plenty of fluids. The flu often goes away on its own. If you have very bad symptoms or other problems, you may be treated in a hospital. Follow these instructions at home:     Activity  Rest as needed. Get plenty of sleep.  Stay home from work or school as told by your doctor. ? Do not leave home until you do not have a fever for  24 hours without taking medicine. ? Leave home only to visit your doctor. Eating and drinking  Take an ORS (oral rehydration solution). This is a drink that is sold at pharmacies and stores.  Drink enough fluid to keep your pee (urine) pale yellow.  Drink clear fluids in small amounts as you are able. Clear fluids include: ? Water. ? Ice chips. ? Fruit juice that has water added (diluted fruit juice). ? Low-calorie sports drinks.  Eat bland, easy-to-digest foods in small amounts as you are able. These foods  include: ? Bananas. ? Applesauce. ? Rice. ? Lean meats. ? Toast. ? Crackers.  Do not eat or drink: ? Fluids that have a lot of sugar or caffeine. ? Alcohol. ? Spicy or fatty foods. General instructions  Take over-the-counter and prescription medicines only as told by your doctor.  Use a cool mist humidifier to add moisture to the air in your home. This can make it easier for you to breathe.  Cover your mouth and nose when you cough or sneeze.  Wash your hands with soap and water often, especially after you cough or sneeze. If you cannot use soap and water, use alcohol-based hand sanitizer.  Keep all follow-up visits as told by your doctor. This is important. How is this prevented?   Get a flu shot every year. You may get the flu shot in late summer, fall, or winter. Ask your doctor when you should get your flu shot.  Avoid contact with people who are sick during fall and winter (cold and flu season). Contact a doctor if:  You get new symptoms.  You have: ? Chest pain. ? Watery poop (diarrhea). ? A fever.  Your cough gets worse.  You start to have more mucus.  You feel sick to your stomach.  You throw up. Get help right away if you:  Have shortness of breath.  Have trouble breathing.  Have skin or nails that turn a bluish color.  Have very bad pain or stiffness in your neck.  Get a sudden headache.  Get sudden pain in your face or ear.  Cannot eat or drink without throwing up. Summary  Influenza ("the flu") is an infection in the lungs, nose, and throat. It is caused by a virus.  Take over-the-counter and prescription medicines only as told by your doctor.  Getting a flu shot every year is the best way to avoid getting the flu. This information is not intended to replace advice given to you by your health care provider. Make sure you discuss any questions you have with your health care provider. Document Released: 09/04/2008 Document Revised:  05/14/2018 Document Reviewed: 05/14/2018 Elsevier Interactive Patient Education  2019 [employer].

## 2018-12-16 NOTE — Progress Notes (Signed)
MRN: [id number] DOB: 11/13/1981  Subjective:   Ashley Gardner is a 38 y.o. female presenting for chief complaint of Cough (x 2 days); Nasal Congestion (x 2 days); Sore Throat (x 2 days); Headache (x 2 days); and Chills (x 2 days ) .  Reports 2 day history of sudden onset body aches, subjective fever, chills, dry cough, nasal congestion, sneezing, sore throat, and headache. Has tried mucinex with no full relief. Denies sinus pain, difficulty swallowing, pain with swallowing, inability to swallow, voice change, productive cough, wheezing, shortness of breath and chest pain, nausea, vomiting, abdominal pain and diarrhea. No known sick contact exposure.  PMH of tachycardia, did not take metoprolol today, prediabetes, HLD, and seasonal allergies, No PMH of asthma or COPD. Patient has had flu shot this season. Denies smoking. Denies any other aggravating or relieving factors, no other questions or concerns.  Review of Systems  Respiratory: Negative for hemoptysis and sputum production.   Skin: Negative for rash.  Neurological: Negative for dizziness.     Ashley Gardner has a current medication list which includes the following prescription(s): fluoxetine, guaifenesin, levonorgestrel, metoprolol succinate, ranitidine, azelastine, benzonatate, nitrofurantoin (macrocrystal-monohydrate), and oseltamivir. Also is allergic to penicillins.  Ashley Gardner  has a past medical history of Anxiety, Depression, Hyperlipidemia, Hypersomnia, Palpitations, and Restlessness. Also  has a past surgical history that includes LEFT MASS BIOPSY.   Objective:   Vitals: BP 120/78 (BP Location: Right Arm, Patient Position: Sitting, Cuff Size: Normal)   Pulse (!) 124   Temp 99.5 F (37.5 C) (Oral)   Resp 18   Wt 167 lb (75.8 kg)   SpO2 98%   BMI 32.61 kg/m   Physical Exam Vitals signs reviewed.  Constitutional:      General: She is not in acute distress.    Appearance: She is well-developed. She is not toxic-appearing.   Comments: Appears uncomfortable sitting on exam table. Frequent sneezing and coughing noted.   HENT:     Head: Normocephalic and atraumatic.     Right Ear: Tympanic membrane, ear canal and external ear normal.     Left Ear: Tympanic membrane, ear canal and external ear normal.     Nose: Mucosal edema and congestion present.     Right Sinus: No maxillary sinus tenderness or frontal sinus tenderness.     Left Sinus: No maxillary sinus tenderness or frontal sinus tenderness.     Mouth/Throat:     Lips: Pink.     Mouth: Mucous membranes are moist.     Pharynx: Uvula midline. Posterior oropharyngeal erythema present.     Tonsils: No tonsillar exudate or tonsillar abscesses.  Eyes:     Conjunctiva/sclera: Conjunctivae normal.  Neck:     Musculoskeletal: Normal range of motion.  Cardiovascular:     Rate and Rhythm: Normal rate and regular rhythm.     Heart sounds: Normal heart sounds.  Pulmonary:     Effort: Pulmonary effort is normal.     Breath sounds: Normal breath sounds. No decreased breath sounds, wheezing, rhonchi or rales.  Lymphadenopathy:     Head:     Right side of head: No submental, submandibular, tonsillar, preauricular, posterior auricular or occipital adenopathy.     Left side of head: No submental, submandibular, tonsillar, preauricular, posterior auricular or occipital adenopathy.     Cervical: No cervical adenopathy.     Upper Body:     Right upper body: No supraclavicular adenopathy.     Left upper body: No supraclavicular adenopathy.  Skin:  General: Skin is warm and dry.  Neurological:     Mental Status: She is alert.     Results for orders placed or performed in visit on 12/16/18 (from the past 24 hour(s))  POCT Influenza A/B     Status: Abnormal   Collection Time: 12/16/18 12:59 PM  Result Value Ref Range   Influenza A, POC Positive (A) Negative   Influenza B, POC Negative Negative  POCT rapid strep A     Status: Normal   Collection Time: 12/16/18 12:59  PM  Result Value Ref Range   Rapid Strep A Screen Negative Negative    Assessment and Plan :  1. Influenza A POCT influenza test positive for influenza A. Afebrile in office. She is tachycardic in office, did not take beta blocker today. Appears as if she does not feel well but is in NAD. Discussed natural history of the disease and treatment options; including supportive care and antiviral therapy. Encouraged rest, hydration, and to continue OTC tylenol or ibuprofen as prescribed for fever. 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 Recommended wearing a mask daily especially around other people. Remain out of work until afebrile for 24 hours w/o use of antipyretics. Educated on potential complications of the flu. Advised to follow up in clinic or with PCP if she develops any of these concerning symptoms or if current symptoms persist outside of discussed boundaries. - oseltamivir (TAMIFLU) 75 MG capsule; Take 1 capsule (75 mg total) by mouth 2 (two) times daily.  Dispense: 10 capsule; Refill: 0 - benzonatate (TESSALON) 100 MG capsule; Take 1-2 capsules (100-200 mg total) by mouth 3 (three) times daily as needed for cough.  Dispense: 40 capsule; Refill: 0 - azelastine (ASTELIN) 0.1 % nasal spray; Place 1 spray into both nostrils 2 (two) times daily. Use in each nostril as directed  Dispense: 30 mL; Refill: 0  2. Flu-like symptoms - POCT Influenza A/B  3. Sore throat - POCT rapid strep A   Benjiman CoreBrittany Brent Noto, PA-C  [facility] 12/16/2018 2:08 PM

## 2019-01-02 DIAGNOSIS — Z6831 Body mass index (BMI) 31.0-31.9, adult: Secondary | ICD-10-CM | POA: Diagnosis not present

## 2019-01-02 DIAGNOSIS — R635 Abnormal weight gain: Secondary | ICD-10-CM | POA: Diagnosis not present

## 2019-01-02 DIAGNOSIS — R232 Flushing: Secondary | ICD-10-CM | POA: Diagnosis not present

## 2019-01-02 DIAGNOSIS — N949 Unspecified condition associated with female genital organs and menstrual cycle: Secondary | ICD-10-CM | POA: Diagnosis not present

## 2019-01-02 DIAGNOSIS — Z01419 Encounter for gynecological examination (general) (routine) without abnormal findings: Secondary | ICD-10-CM | POA: Diagnosis not present

## 2019-01-05 ENCOUNTER — Encounter: Payer: Self-pay | Admitting: Family Medicine

## 2019-01-10 MED ORDER — BUPROPION HCL ER (SR) 150 MG PO TB12: 150.0000 mg | ORAL_TABLET | Freq: Two times a day (BID) | ORAL | 2 refills | Status: DC

## 2019-01-10 NOTE — Telephone Encounter (Signed)
pls see note 

## 2019-01-10 NOTE — Telephone Encounter (Signed)
Had TSH checked on appt with OBGYN.  Also had hormone testing recently that was ok.   Has had some depression, as well as anxiety. Stress eating when overwhelmed.  No hx of seizure d/o. Has heard some good things with wellbutrin. Off prozac for past week.   Weight down 3 pounds. Today. Still would like to try wellbutrin.  Start Wellbutrin SR 150 mg daily x3 days then increase to twice daily.  Advised to update me with symptoms over the next few weeks.  Also discussed other ways to treat times of feeling overwhelmed/stressed with goal of avoidance of stress eating.

## 2019-01-12 MED FILL — BUPROPION HCL SR 150 MG TAB: 150 | 30 days supply | Qty: 60 | Fill #0

## 2019-01-20 DIAGNOSIS — R635 Abnormal weight gain: Secondary | ICD-10-CM | POA: Diagnosis not present

## 2019-01-20 DIAGNOSIS — R740 Nonspecific elevation of levels of transaminase and lactic acid dehydrogenase [LDH]: Secondary | ICD-10-CM | POA: Diagnosis not present

## 2019-01-20 DIAGNOSIS — R102 Pelvic and perineal pain: Secondary | ICD-10-CM | POA: Diagnosis not present

## 2019-01-20 DIAGNOSIS — N83202 Unspecified ovarian cyst, left side: Secondary | ICD-10-CM | POA: Diagnosis not present

## 2019-01-30 ENCOUNTER — Telehealth: Payer: 59 | Admitting: Family

## 2019-01-30 DIAGNOSIS — N390 Urinary tract infection, site not specified: Secondary | ICD-10-CM

## 2019-01-30 DIAGNOSIS — A499 Bacterial infection, unspecified: Secondary | ICD-10-CM | POA: Diagnosis not present

## 2019-01-30 MED ORDER — SULFAMETHOXAZOLE-TRIMETHOPRIM 800-160 MG PO TABS: 1.0000 | ORAL_TABLET | Freq: Two times a day (BID) | ORAL | 0 refills | Status: DC

## 2019-01-30 NOTE — Progress Notes (Signed)

## 2019-02-12 MED FILL — BUPROPION HCL SR 150 MG TAB: 150 | 30 days supply | Qty: 60 | Fill #1

## 2019-02-20 ENCOUNTER — Other Ambulatory Visit: Payer: Self-pay | Admitting: Family Medicine

## 2019-02-20 DIAGNOSIS — Z87898 Personal history of other specified conditions: Secondary | ICD-10-CM

## 2019-02-20 MED FILL — METOPROLOL SUCCINATE ER 25: 25 | 30 days supply | Qty: 30 | Fill #0

## 2019-02-20 NOTE — Telephone Encounter (Signed)
Appt made 03/27/19 filling with enough medication to get to appt

## 2019-02-20 NOTE — Telephone Encounter (Signed)
Requested medication (s) are due for refill today: yes  Requested medication (s) are on the active medication list: yes  Last refill:  07/08/18   Future visit scheduled: no- called pt and left message to make appointment- pt >30 months over due and no f/u- routed to [place]   Notes to clinic:  Please review   Requested Prescriptions  Pending Prescriptions Disp Refills   metoprolol succinate (TOPROL-XL) 25 MG 24 hr tablet [Pharmacy Med Name: METOPROLOL SUCCINATE ER 25 25 TAB] 90 tablet 1    Sig: TAKE 1 TABLET BY MOUTH ONCE DAILY     Cardiovascular:  Beta Blockers Failed - 02/20/2019  9:55 AM      Failed - Last Heart Rate in normal range    Pulse Readings from Last 1 Encounters:  12/16/18 (!) 124         Failed - Valid encounter within last 6 months    Recent Outpatient Visits          6 months ago Palpitations   [facility] at [facility], Asencion Partridge, MD   7 months ago Tingling sensation in face   Primary Care at [facility], Asencion Partridge, MD             Passed - Last BP in normal range    BP Readings from Last 1 Encounters:  12/16/18 120/78

## 2019-03-20 ENCOUNTER — Other Ambulatory Visit: Payer: Self-pay | Admitting: Emergency Medicine

## 2019-03-27 ENCOUNTER — Other Ambulatory Visit: Payer: Self-pay

## 2019-03-27 ENCOUNTER — Telehealth (INDEPENDENT_AMBULATORY_CARE_PROVIDER_SITE_OTHER): Payer: 59 | Admitting: Family Medicine

## 2019-03-27 DIAGNOSIS — F418 Other specified anxiety disorders: Secondary | ICD-10-CM | POA: Diagnosis not present

## 2019-03-27 DIAGNOSIS — Z87898 Personal history of other specified conditions: Secondary | ICD-10-CM

## 2019-03-27 MED ORDER — METOPROLOL SUCCINATE ER 25 MG PO TB24: 25.0000 mg | ORAL_TABLET | Freq: Every day | ORAL | 1 refills | Status: DC

## 2019-03-27 MED ORDER — BUPROPION HCL ER (SR) 200 MG PO TB12: 200.0000 mg | ORAL_TABLET | Freq: Two times a day (BID) | ORAL | 3 refills | Status: DC

## 2019-03-27 MED FILL — METOPROLOL SUCCINATE ER 25: 25 | 90 days supply | Qty: 90 | Fill #0

## 2019-03-27 MED FILL — BUPROPION HCL SR 200 MG TAB: 200 | 30 days supply | Qty: 60 | Fill #0

## 2019-03-27 NOTE — Patient Instructions (Addendum)
I do recommend meeting with EAP: Visit our Website  [email or web address]  For Information, Appointments, or  Crisis Assistance contact us:  Phone: [phone] or [phone]  Email: [email or web address]   We can increase wellbutrin to 427m total per day for now.   Work on sChild psychotherapist and coping techniques below.  Let me know how things are going in the next 2 weeks.  Good talking to you today.  Take care.    Stress Stress is a normal reaction to life events. Stress is what you feel when life demands more than you are used to, or more than you think you can handle. Some stress can be useful, such as studying for a test or meeting a deadline at work. Stress that occurs too often or for too long can cause problems. It can affect your emotional health and interfere with relationships and normal daily activities. Too much stress can weaken your body's defense system (immune system) and increase your risk for physical illness. If you already have a medical problem, stress can make it worse. What are the causes? All sorts of life events can cause stress. An event that causes stress for one person may not be stressful for another person. Major life events, whether positive or negative, commonly cause stress. Examples include:  Losing a job or starting a new job.  Losing a loved one.  Moving to a new town or home.  Getting married or divorced.  Having a baby.  Injury or illness. Less obvious life events can also cause stress, especially if they occur day after day or in combination with each other. Examples include:  Working long hours.  Driving in traffic.  Caring for children.  Being in debt.  Being in a difficult relationship. What are the signs or symptoms? Stress can cause emotional symptoms, including:  Anxiety. This is feeling worried, afraid, on edge, overwhelmed, or out of control.  Anger, including  irritation or impatience.  Depression. This is feeling sad, down, helpless, or guilty.  Trouble focusing, remembering, or making decisions. Stress can cause physical symptoms, including:  Aches and pains. These may affect your head, neck, back, stomach, or other areas of your body.  Tight muscles or a clenched jaw.  Low energy.  Trouble sleeping. Stress can cause unhealthy behaviors, including:  Eating to feel better (overeating) or skipping meals.  Working too much or putting off tasks.  Smoking, drinking alcohol, or using drugs to feel better. How is this diagnosed? Stress is diagnosed through an assessment by your health care provider. He or she may diagnose this condition based on:  Your symptoms and any stressful life events.  Your medical history.  Tests to rule out other causes of your symptoms. Depending on your condition, your health care provider may refer you to a specialist for further evaluation. How is this treated?  Stress management techniques are the recommended treatment for stress. Medicine is not typically recommended for the treatment of stress. Techniques to reduce your reaction to stressful life events include:  Stress identification. Monitor yourself for symptoms of stress and identify what causes stress for you. These skills may help you to avoid or prepare for stressful events.  Time management. Set your priorities, keep a calendar of events, and learn to say "no." Taking these actions can help you avoid making too many commitments. Techniques for coping with stress include:  Rethinking the problem. Try to think realistically about stressful events rather than ignoring them or overreacting. Try to find the positives  in a stressful situation rather than focusing on the negatives.  Exercise. Physical exercise can release both physical and emotional tension. The key is to find a form of exercise that you enjoy and do it regularly.  Relaxation  techniques. These relax the body and mind. The key is to find one or more that you enjoy and use the technique(s) regularly. Examples include: ? Meditation, deep breathing, or progressive relaxation techniques. ? Yoga or tai chi. ? Biofeedback, mindfulness techniques, or journaling. ? Listening to music, being out in nature, or participating in other hobbies.  Practicing a healthy lifestyle. Eat a balanced diet, drink plenty of water, limit or avoid caffeine, and get plenty of sleep.  Having a strong support network. Spend time with family, friends, or other people you enjoy being around. Express your feelings and talk things over with someone you trust. Counseling or talk therapy with a mental health professional may be helpful if you are having trouble managing stress on your own. Follow these instructions at home: Lifestyle   Avoid drugs.  Do not use any products that contain nicotine or tobacco, such as cigarettes and e-cigarettes. If you need help quitting, ask your health care provider.  Limit alcohol intake to no more than 1 drink a day for nonpregnant women and 2 drinks a day for men. One drink equals 12 oz of beer, 5 oz of wine, or 1 oz of hard liquor.  Do not use alcohol or drugs to relax.  Eat a balanced diet that includes fresh fruits and vegetables, whole grains, lean meats, fish, eggs, and beans, and low-fat dairy. Avoid processed foods and foods high in added fat, sugar, and salt.  Exercise at least 30 minutes on 5 or more days each week.  Get 7-8 hours of sleep each night. General instructions   Practice stress management techniques as discussed with your health care provider.  Drink enough fluid to keep your urine clear or pale yellow.  Take over-the-counter and prescription medicines only as told by your health care provider.  Keep all follow-up visits as told by your health care provider. This is important. Contact a health care provider if:  Your symptoms  get worse.  You have new symptoms.  You feel overwhelmed by your problems and can no longer manage them on your own. Get help right away if:  You have thoughts of hurting yourself or others. If you ever feel like you may hurt yourself or others, or have thoughts about taking your own life, get help right away. You can go to your nearest emergency department or call:  Your local emergency services (911 in the [place]  A suicide crisis helpline, such as the [place] at (502) 450-9100. This is open 24 hours a day. Summary  Stress is a normal reaction to life events. It can cause problems if it happens too often or for too long.  Practicing stress management techniques is the best way to treat stress.  Counseling or talk therapy with a mental health professional may be helpful if you are having trouble managing stress on your own. This information is not intended to replace advice given to you by your health care provider. Make sure you discuss any questions you have with your health care provider. Document Released: 05/22/2001 Document Revised: 01/16/2017 Document Reviewed: 01/16/2017 Elsevier Interactive Patient Education  [employer].      If you have lab work done today you will be contacted with your lab results within  the next 2 weeks.  If you have not heard from [place] then please contact us. The fastest way to get your results is to register for My Chart.   IF you received an x-ray today, you will receive an invoice from [facility]. Please contact [facility] at [phone] with questions or concerns regarding your invoice.   IF you received labwork today, you will receive an invoice from [place]. Please contact [facility] at [phone] with questions or concerns regarding your invoice.   Our billing staff will not be able to assist you with questions regarding bills from these companies.  You will be contacted with the lab  results as soon as they are available. The fastest way to get your results is to activate your My Chart account. Instructions are located on the last page of this paperwork. If you have not heard from [place] regarding the results in 2 weeks, please contact this office.

## 2019-03-27 NOTE — Progress Notes (Signed)
CC- Refill medication for depression and blood pressure. Feels like need to up the bupropion medication. Not having any issue with the metoprolol. Have not been monitoring bp at home.

## 2019-03-27 NOTE — Progress Notes (Signed)
Virtual Visit via Telephone Note  I connected with Ashley Gardner on 03/27/19 at 2:06 PM by telephone and verified that I am speaking with the correct person using two identifiers.   I discussed the limitations, risks, security and privacy concerns of performing an evaluation and management service by telephone and the availability of in person appointments. I also discussed with the patient that there may be a patient responsible charge related to this service. The patient expressed understanding and agreed to proceed, consent obtained  Chief complaint: Anxiety/depression.   History of Present Illness:  Depression:  Depression screen [facility] 2/9 03/27/2019 07/29/2018 07/08/2018  Decreased Interest 0 0 0  Down, Depressed, Hopeless 0 0 0  PHQ - 2 Score 0 0 0  See prior visits, subsequently had telephone call after my chart messaging on February 1.  Reported recent TSH by OB/GYN and hormone testing that apparently was okay.  Was having some depression as well as anxiety, stress eating when overwhelmed.  Did not tolerate Prozac, preferred to try Wellbutrin.  Start initially 150 mg then increase to 300 mg after few days.  Coping techniques for anxiety symptoms discussed to minimize stress eating.  Taking wellbutrin in the am and then again midday at 3pm.  Started really well. Helped with stress eating/anxiety eating.  More difficult past month with home schooling, working from  [employer] to be back to some of initial symptoms. No SI/HI.  No recent counseling. Has not reached out to EAP.  Not exercising. Less motivation.  Works 4hrs in am, then helping son with school, then dinner, then 8-12p. Feels like needs more time to do what she needs to do. Some difficulty with finding time.  Crying more last week - daily.   Palpitations: Had been doing well. Some recent sx's with anxiety.    Patient Active Problem List   Diagnosis Date Noted  . Family history of early CAD 09/30/2017  . Elevated blood  pressure reading 08/06/2017  . Generalized anxiety disorder 08/06/2017  . Insomnia 08/06/2017  . Current severe episode of major depressive disorder without psychotic features without prior episode ([place]) 08/06/2017   Past Medical History:  Diagnosis Date  . Anxiety    Decreased concentration, Excessive worry, insomnia, irritability, nervous/anxious behavior,obessions(food) restlessness.  . Depression    Feelings of hopelessness, feeling of worthlessness  . Hyperlipidemia   . Hypersomnia   . Palpitations   . Restlessness    Past Surgical History:  Procedure Laterality Date  . LEFT MASS BIOPSY     Benign appearing fragments ofbone and fibrous tissue, deferential    Allergies  Allergen Reactions  . Penicillins Hives   Prior to Admission medications   Medication Sig Start Date End Date Taking? Authorizing Provider  buPROPion (WELLBUTRIN SR) 150 MG 12 hr tablet Take 1 tablet (150 mg total) by mouth 2 (two) times daily. Start once per day for initial 3 days. 01/10/19  Yes [name], MD  Levonorgestrel (LILETTA, 52 MG,) 19.5 MCG/DAY IUD IUD by Intrauterine route.   Yes [provider]  metoprolol succinate (TOPROL-XL) 25 MG 24 hr tablet TAKE 1 TABLET BY MOUTH ONCE DAILY 02/20/19  Yes [name], MD  azelastine (ASTELIN) 0.1 % nasal spray Place 1 spray into both nostrils 2 (two) times daily. Use in each nostril as directed Patient not taking: Reported on 03/27/2019 12/16/18   Leonie Douglas, PA-C   Social History   Socioeconomic History  . Marital status: Married  Spouse name: Not on file  . Number of children: Not on file  . Years of education: Not on file  . Highest education level: Not on file  Occupational History  . Not on file  Social Needs  . Financial resource strain: Not on file  . Food insecurity:    Worry: Not on file    Inability: Not on file  . Transportation needs:    Medical: Not on file    Non-medical: Not on file  Tobacco Use  .  Smoking status: Never Smoker  . Smokeless tobacco: Never Used  Substance and Sexual Activity  . Alcohol use: Yes  . Drug use: No  . Sexual activity: Not on file  Lifestyle  . Physical activity:    Days per week: Not on file    Minutes per session: Not on file  . Stress: Not on file  Relationships  . Social connections:    Talks on phone: Not on file    Gets together: Not on file    Attends religious service: Not on file    Active member of club or organization: Not on file    Attends meetings of clubs or organizations: Not on file    Relationship status: Not on file  . Intimate partner violence:    Fear of current or ex partner: Not on file    Emotionally abused: Not on file    Physically abused: Not on file    Forced sexual activity: Not on file  Other Topics Concern  . Not on file  Social History Narrative  . Not on file     Observations/Objective: Speaking normally on phone without distress, appropriate responses, no SI/HI.  Assessment and Plan: Depression with anxiety - Plan: buPROPion (WELLBUTRIN SR) 200 MG 12 hr tablet  History of palpitations - Plan: metoprolol succinate (TOPROL-XL) 25 MG 24 hr tablet  Suspect some of her increased symptoms of depression with current stressors from COVID-19 pandemic.   -Reinforced need to work on coping techniques/stress management techniques, and handout was given.  Restarting some form of exercise should help.   -will increase the Wellbutrin to 400 mg total per day with 200 mg twice daily.   -stressed importance of counseling and phone number provided to EAP program through work.     - Continue same dose of Toprol for now as palpitations likely due to anxiety symptoms.  Consider higher dose if more persistent.  -Follow-up with my chart update within the next week or two on symptoms. Follow Up Instructions:  Patient Instructions    I do recommend meeting with EAP: Visit our Website   [email or web address]  For Information, Appointments, or  Crisis Assistance contact us:  Phone: [phone] or [phone]  Email: [email or web address]   We can increase wellbutrin to 465m total per day for now.   Work on sChild psychotherapist and coping techniques below.  Let me know how things are going in the next 2 weeks.  Good talking to you today.  Take care.    Stress Stress is a normal reaction to life events. Stress is what you feel when life demands more than you are used to, or more than you think you can handle. Some stress can be useful, such as studying for a test or meeting a deadline at work. Stress that occurs too often or for too long can cause problems. It can affect your emotional health and interfere with relationships and normal daily activities. Too much stress can weaken your body's  defense system (immune system) and increase your risk for physical illness. If you already have a medical problem, stress can make it worse. What are the causes? All sorts of life events can cause stress. An event that causes stress for one person may not be stressful for another person. Major life events, whether positive or negative, commonly cause stress. Examples include:  Losing a job or starting a new job.  Losing a loved one.  Moving to a new town or home.  Getting married or divorced.  Having a baby.  Injury or illness. Less obvious life events can also cause stress, especially if they occur day after day or in combination with each other. Examples include:  Working long hours.  Driving in traffic.  Caring for children.  Being in debt.  Being in a difficult relationship. What are the signs or symptoms? Stress can cause emotional symptoms, including:  Anxiety. This is feeling worried, afraid, on edge, overwhelmed, or out of control.  Anger, including irritation or impatience.  Depression. This is feeling sad,  down, helpless, or guilty.  Trouble focusing, remembering, or making decisions. Stress can cause physical symptoms, including:  Aches and pains. These may affect your head, neck, back, stomach, or other areas of your body.  Tight muscles or a clenched jaw.  Low energy.  Trouble sleeping. Stress can cause unhealthy behaviors, including:  Eating to feel better (overeating) or skipping meals.  Working too much or putting off tasks.  Smoking, drinking alcohol, or using drugs to feel better. How is this diagnosed? Stress is diagnosed through an assessment by your health care provider. He or she may diagnose this condition based on:  Your symptoms and any stressful life events.  Your medical history.  Tests to rule out other causes of your symptoms. Depending on your condition, your health care provider may refer you to a specialist for further evaluation. How is this treated?  Stress management techniques are the recommended treatment for stress. Medicine is not typically recommended for the treatment of stress. Techniques to reduce your reaction to stressful life events include:  Stress identification. Monitor yourself for symptoms of stress and identify what causes stress for you. These skills may help you to avoid or prepare for stressful events.  Time management. Set your priorities, keep a calendar of events, and learn to say "no." Taking these actions can help you avoid making too many commitments. Techniques for coping with stress include:  Rethinking the problem. Try to think realistically about stressful events rather than ignoring them or overreacting. Try to find the positives in a stressful situation rather than focusing on the negatives.  Exercise. Physical exercise can release both physical and emotional tension. The key is to find a form of exercise that you enjoy and do it regularly.  Relaxation techniques. These relax the body and mind. The key is to find one or  more that you enjoy and use the technique(s) regularly. Examples include: ? Meditation, deep breathing, or progressive relaxation techniques. ? Yoga or tai chi. ? Biofeedback, mindfulness techniques, or journaling. ? Listening to music, being out in nature, or participating in other hobbies.  Practicing a healthy lifestyle. Eat a balanced diet, drink plenty of water, limit or avoid caffeine, and get plenty of sleep.  Having a strong support network. Spend time with family, friends, or other people you enjoy being around. Express your feelings and talk things over with someone you trust. Counseling or talk therapy with a mental health professional may  be helpful if you are having trouble managing stress on your own. Follow these instructions at home: Lifestyle   Avoid drugs.  Do not use any products that contain nicotine or tobacco, such as cigarettes and e-cigarettes. If you need help quitting, ask your health care provider.  Limit alcohol intake to no more than 1 drink a day for nonpregnant women and 2 drinks a day for men. One drink equals 12 oz of beer, 5 oz of wine, or 1 oz of hard liquor.  Do not use alcohol or drugs to relax.  Eat a balanced diet that includes fresh fruits and vegetables, whole grains, lean meats, fish, eggs, and beans, and low-fat dairy. Avoid processed foods and foods high in added fat, sugar, and salt.  Exercise at least 30 minutes on 5 or more days each week.  Get 7-8 hours of sleep each night. General instructions   Practice stress management techniques as discussed with your health care provider.  Drink enough fluid to keep your urine clear or pale yellow.  Take over-the-counter and prescription medicines only as told by your health care provider.  Keep all follow-up visits as told by your health care provider. This is important. Contact a health care provider if:  Your symptoms get worse.  You have new symptoms.  You feel overwhelmed by your  problems and can no longer manage them on your own. Get help right away if:  You have thoughts of hurting yourself or others. If you ever feel like you may hurt yourself or others, or have thoughts about taking your own life, get help right away. You can go to your nearest emergency department or call:  Your local emergency services (911 in the [place]  A suicide crisis helpline, such as the [place] at (615)232-4381. This is open 24 hours a day. Summary  Stress is a normal reaction to life events. It can cause problems if it happens too often or for too long.  Practicing stress management techniques is the best way to treat stress.  Counseling or talk therapy with a mental health professional may be helpful if you are having trouble managing stress on your own. This information is not intended to replace advice given to you by your health care provider. Make sure you discuss any questions you have with your health care provider. Document Released: 05/22/2001 Document Revised: 01/16/2017 Document Reviewed: 01/16/2017 Elsevier Interactive Patient Education  [employer].      If you have lab work done today you will be contacted with your lab results within the next 2 weeks.  If you have not heard from [place] then please contact us. The fastest way to get your results is to register for My Chart.   IF you received an x-ray today, you will receive an invoice from [facility]. Please contact [facility] at [phone] with questions or concerns regarding your invoice.   IF you received labwork today, you will receive an invoice from [place]. Please contact [facility] at [phone] with questions or concerns regarding your invoice.   Our billing staff will not be able to assist you with questions regarding bills from these companies.  You will be contacted with the lab results as soon as they are available. The fastest way to get your  results is to activate your My Chart account. Instructions are located on the last page of this paperwork. If you have not heard from [place] regarding the results in 2 weeks, please contact  this office.          I discussed the assessment and treatment plan with the patient. The patient was provided an opportunity to ask questions and all were answered. The patient agreed with the plan and demonstrated an understanding of the instructions.   The patient was advised to call back or seek an in-person evaluation if the symptoms worsen or if the condition fails to improve as anticipated.  I provided approx 18 minutes of non-face-to-face time during this encounter.  Signed,   Merri Ray, MD [facility].  03/27/19

## 2019-04-18 NOTE — Progress Notes (Signed)
Greater than 5 minutes, yet less than 10 minutes of time have been spent researching, coordinating, and implementing care for this patient today.  Thank you for the details you included in the comment boxes. Those details are very helpful in determining the best course of treatment for you and help us to provide the best care.  

## 2019-10-12 ENCOUNTER — Other Ambulatory Visit: Payer: Self-pay

## 2019-10-12 DIAGNOSIS — Z20822 Contact with and (suspected) exposure to covid-19: Secondary | ICD-10-CM

## 2019-10-12 DIAGNOSIS — J029 Acute pharyngitis, unspecified: Secondary | ICD-10-CM | POA: Diagnosis not present

## 2019-10-12 DIAGNOSIS — J069 Acute upper respiratory infection, unspecified: Secondary | ICD-10-CM | POA: Diagnosis not present

## 2019-10-13 LAB — NOVEL CORONAVIRUS, NAA: SARS-CoV-2, NAA: NOT DETECTED

## 2019-12-30 ENCOUNTER — Telehealth: Payer: 59 | Admitting: Family

## 2019-12-30 DIAGNOSIS — B3731 Acute candidiasis of vulva and vagina: Secondary | ICD-10-CM

## 2019-12-30 DIAGNOSIS — B373 Candidiasis of vulva and vagina: Secondary | ICD-10-CM

## 2019-12-30 MED ORDER — FLUCONAZOLE 150 MG PO TABS: 150.0000 mg | ORAL_TABLET | ORAL | 0 refills | Status: DC | PRN

## 2019-12-30 NOTE — Progress Notes (Signed)

## 2020-01-03 DIAGNOSIS — R438 Other disturbances of smell and taste: Secondary | ICD-10-CM | POA: Diagnosis not present

## 2020-01-03 DIAGNOSIS — Z1152 Encounter for screening for COVID-19: Secondary | ICD-10-CM | POA: Diagnosis not present

## 2020-01-03 DIAGNOSIS — R509 Fever, unspecified: Secondary | ICD-10-CM | POA: Diagnosis not present

## 2020-01-04 ENCOUNTER — Other Ambulatory Visit: Payer: 59

## 2020-04-07 ENCOUNTER — Other Ambulatory Visit: Payer: Self-pay

## 2020-04-07 ENCOUNTER — Encounter: Payer: Self-pay | Admitting: Family Medicine

## 2020-04-07 ENCOUNTER — Ambulatory Visit (INDEPENDENT_AMBULATORY_CARE_PROVIDER_SITE_OTHER): Payer: BC Managed Care – PPO | Admitting: Family Medicine

## 2020-04-07 VITALS — BP 130/82 | HR 96 | Temp 98.0°F | Ht 60.0 in | Wt 166.0 lb

## 2020-04-07 DIAGNOSIS — Z87898 Personal history of other specified conditions: Secondary | ICD-10-CM

## 2020-04-07 DIAGNOSIS — F418 Other specified anxiety disorders: Secondary | ICD-10-CM

## 2020-04-07 MED ORDER — BUPROPION HCL ER (SR) 150 MG PO TB12: 150.0000 mg | ORAL_TABLET | Freq: Two times a day (BID) | ORAL | 2 refills | Status: DC

## 2020-04-07 MED ORDER — METOPROLOL SUCCINATE ER 25 MG PO TB24: 25.0000 mg | ORAL_TABLET | Freq: Every day | ORAL | 1 refills | Status: DC

## 2020-04-07 NOTE — Progress Notes (Signed)
Subjective:  Patient ID: Ashley Gardner, female    DOB: 1981-07-11  Age: 39 y.o. MRN: [id number]  CC:  Chief Complaint  Patient presents with  . Depression    PHQ-9 score of 20. pt states she has been battleing her depression for a few years now. pt reports that she took herself off her medication and this has effected her alot.  . Anxiety    GAD-7 score of 16.     HPI Ashley Gardner presents for  Depression/Anxiety: Last discussed in April 2020. Depression with anxiety, stress eating at times.  Did not tolerate Prozac, prefer to try Wellbutrin.  Initially started at 150 mg daily, then increase to 300 mg daily.  She was taking Wellbutrin in the morning and then again at midday at 3 PM.  Initially had been helping significantly, then some return of stress eating/anxiety eating.  Home schooling and working from home has been more difficult.  Had not met with counseling or EAP at that time.  Less exercise with decreased motivation.  Wellbutrin was increased to 200 mg twice daily, importance of counseling discussed.  Was continued on Toprol 25 mg for her palpitations which thought to be due in part to anxiety symptoms.  Took '200mg'$  BID. Unknown if better or worse, only took for 1 month. Stopped med for unknown reason. Did not meet with a therapist.  Ashley Gardner through separation in October, sold house. New job - works at [employer]. Not interested in job. 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  Has not met with counseling, but would like to do so now.  Some stress eating/anxiety eating.  Rare palpitations with anxiety. Off toprol - better on meds.  Some walking on occasion for exercise.  Wt Readings from Last 3 Encounters:  04/07/20 166 lb (75.3 kg)  12/16/18 167 lb (75.8 kg)  07/29/18 163 lb 6.4 oz (74.1 kg)     Depression screen [facility] 2/9 04/07/2020 03/27/2019 07/29/2018 07/08/2018  Decreased Interest 2 0 0 0  Down, Depressed, Hopeless 3 0 0 0  PHQ - 2 Score 5 0 0 0  Altered  sleeping 3 - - -  Tired, decreased energy 3 - - -  Change in appetite 3 - - -  Feeling bad or failure about yourself  2 - - -  Trouble concentrating 3 - - -  Moving slowly or fidgety/restless 1 - - -  Suicidal thoughts 0 - - -  PHQ-9 Score 20 - - -   GAD 7 : Generalized Anxiety Score 04/07/2020  Nervous, Anxious, on Edge 2  Control/stop worrying 3  Worry too much - different things 3  Trouble relaxing 3  Restless 2  Easily annoyed or irritable 3  Afraid - awful might happen 0  Total GAD 7 Score 16      History Patient Active Problem List   Diagnosis Date Noted  . Family history of early CAD 09/30/2017  . Elevated blood pressure reading 08/06/2017  . Generalized anxiety disorder 08/06/2017  . Insomnia 08/06/2017  . Current severe episode of major depressive disorder without psychotic features without prior episode ([place]) 08/06/2017   Past Medical History:  Diagnosis Date  . Anxiety    Decreased concentration, Excessive worry, insomnia, irritability, nervous/anxious behavior,obessions(food) restlessness.  . Depression    Feelings of hopelessness, feeling of worthlessness  . Hyperlipidemia   . Hypersomnia   . Palpitations   . Restlessness    Past Surgical History:  Procedure Laterality  Date  . LEFT MASS BIOPSY     Benign appearing fragments ofbone and fibrous tissue, deferential    Allergies  Allergen Reactions  . Penicillins Hives   Prior to Admission medications   Medication Sig Start Date End Date Taking? Authorizing Provider  Levonorgestrel (LILETTA, 52 MG,) 19.5 MCG/DAY IUD IUD by Intrauterine route.   Yes [provider]  azelastine (ASTELIN) 0.1 % nasal spray Place 1 spray into both nostrils 2 (two) times daily. Use in each nostril as directed Patient not taking: Reported on 03/27/2019 12/16/18   Tenna Delaine D, PA-C  buPROPion [facility] SR) 200 MG 12 hr tablet Take 1 tablet (200 mg total) by mouth 2 (two) times daily. Patient not taking:  Reported on 04/07/2020 03/27/19   [name], MD  fluconazole (DIFLUCAN) 150 MG tablet Take 1 tablet (150 mg total) by mouth every three (3) days as needed. 12/30/19   Evelina Dun A, FNP  metoprolol succinate (TOPROL-XL) 25 MG 24 hr tablet Take 1 tablet (25 mg total) by mouth daily. Patient not taking: Reported on 04/07/2020 03/27/19   [name], MD   Social History   Socioeconomic History  . Marital status: Married    Spouse name: Not on file  . Number of children: Not on file  . Years of education: Not on file  . Highest education level: Not on file  Occupational History  . Not on file  Tobacco Use  . Smoking status: Never Smoker  . Smokeless tobacco: Never Used  Substance and Sexual Activity  . Alcohol use: Yes  . Drug use: No  . Sexual activity: Not on file  Other Topics Concern  . Not on file  Social History Narrative  . Not on file   Social Determinants of Health   Financial Resource Strain:   . Difficulty of Paying Living Expenses:   Food Insecurity:   . Worried About Charity fundraiser in the Last Year:   . Arboriculturist in the Last Year:   Transportation Needs:   . Film/video editor (Medical):   Marland Kitchen Lack of Transportation (Non-Medical):   Physical Activity:   . Days of Exercise per Week:   . Minutes of Exercise per Session:   Stress:   . Feeling of Stress :   Social Connections:   . Frequency of Communication with Friends and Family:   . Frequency of Social Gatherings with Friends and Family:   . Attends Religious Services:   . Active Member of Clubs or Organizations:   . Attends Archivist Meetings:   Marland Kitchen Marital Status:   Intimate Partner Violence:   . Fear of Current or Ex-Partner:   . Emotionally Abused:   Marland Kitchen Physically Abused:   . Sexually Abused:     Review of Systems Per HPI.   Objective:   Vitals:   04/07/20 0838 04/07/20 0842  BP: (!) 165/98 130/82  Pulse: 96   Temp: 98 F (36.7 C)   TempSrc: Temporal     SpO2: 98%   Weight: 166 lb (75.3 kg)   Height: 5' (1.524 m)      Physical Exam Vitals reviewed.  Constitutional:      Appearance: She is well-developed.  HENT:     Head: Normocephalic and atraumatic.  Eyes:     Conjunctiva/sclera: Conjunctivae normal.     Pupils: Pupils are equal, round, and reactive to light.  Neck:     Vascular: No carotid bruit.  Cardiovascular:  Rate and Rhythm: Normal rate and regular rhythm.     Heart sounds: Normal heart sounds.  Pulmonary:     Effort: Pulmonary effort is normal.     Breath sounds: Normal breath sounds.  Abdominal:     Palpations: Abdomen is soft. There is no pulsatile mass.     Tenderness: There is no abdominal tenderness.  Skin:    General: Skin is warm and dry.  Neurological:     Mental Status: She is alert and oriented to person, place, and time.  Psychiatric:        Attention and Perception: Attention and perception normal.        Mood and Affect: Mood is depressed. Affect is tearful.        Speech: Speech normal.        Behavior: Behavior normal. Behavior is cooperative.        Thought Content: Thought content normal. Thought content does not include homicidal or suicidal ideation.        Cognition and Memory: Cognition normal.        Assessment & Plan:  Ashley Gardner is a 39 y.o. female . Depression with anxiety - Plan: buPROPion (WELLBUTRIN SR) 150 MG 12 hr tablet  History of palpitations - Plan: metoprolol succinate (TOPROL-XL) 25 MG 24 hr tablet  Decreased control of above symptoms off medications.  Restart Wellbutrin 150 mg daily for 1 week then increase to twice daily.  Importance of counseling discussed, phone numbers provided.  Exercise will also be helpful once evaluation increases, improved control of depression.  Restart Toprol but anticipate improvement in palpitations with anxiety control as well.  Recheck 6 weeks.  Meds ordered this encounter  Medications  . buPROPion (WELLBUTRIN SR) 150 MG 12 hr  tablet    Sig: Take 1 tablet (150 mg total) by mouth 2 (two) times daily. Start 1 po qd for 1st week.    Dispense:  60 tablet    Refill:  2  . metoprolol succinate (TOPROL-XL) 25 MG 24 hr tablet    Sig: Take 1 tablet (25 mg total) by mouth daily.    Dispense:  90 tablet    Refill:  1   Patient Instructions   Restart wellbutrin, toprol. I expect symptoms to improve, but also recommend counseling and execise as you feel more motivated to do so.  Recheck in 6 weeks, but let me know if questions sooner. Thanks for coming in today.   Here are a few options for counseling:  [facility]: Zella Ball [phone]  [facility] [phone]    If you have lab work done today you will be contacted with your lab results within the next 2 weeks.  If you have not heard from [place] then please contact us. The fastest way to get your results is to register for My Chart.   IF you received an x-ray today, you will receive an invoice from [facility]. Please contact [facility] at [phone] with questions or concerns regarding your invoice.   IF you received labwork today, you will receive an invoice from [facility]. Please contact [facility] at [phone] with questions or concerns regarding your invoice.   Our billing staff will not be able to assist you with questions regarding bills from these companies.  You will be contacted with the lab results as soon as they are available. The fastest way to get your results is to activate your My Chart account. Instructions are located on the last page  of this paperwork. If you have not heard from [place] regarding the results in 2 weeks, please contact this office.         Signed, Merri Ray, MD [facility]

## 2020-04-07 NOTE — Patient Instructions (Addendum)
Restart wellbutrin, toprol. I expect symptoms to improve, but also recommend counseling and execise as you feel more motivated to do so.  Recheck in 6 weeks, but let me know if questions sooner. Thanks for coming in today.   Here are a few options for counseling:  [facility]: [name] [phone]  [facility] [phone]    If you have lab work done today you will be contacted with your lab results within the next 2 weeks.  If you have not heard from [place] then please contact us. The fastest way to get your results is to register for My Chart.   IF you received an x-ray today, you will receive an invoice from [facility]. Please contact [facility] at [phone] with questions or concerns regarding your invoice.   IF you received labwork today, you will receive an invoice from [facility]. Please contact [facility] at [phone] with questions or concerns regarding your invoice.   Our billing staff will not be able to assist you with questions regarding bills from these companies.  You will be contacted with the lab results as soon as they are available. The fastest way to get your results is to activate your My Chart account. Instructions are located on the last page of this paperwork. If you have not heard from [place] regarding the results in 2 weeks, please contact this office.

## 2020-04-13 DIAGNOSIS — L659 Nonscarring hair loss, unspecified: Secondary | ICD-10-CM | POA: Diagnosis not present

## 2020-04-13 DIAGNOSIS — R635 Abnormal weight gain: Secondary | ICD-10-CM | POA: Diagnosis not present

## 2020-04-13 DIAGNOSIS — Z01419 Encounter for gynecological examination (general) (routine) without abnormal findings: Secondary | ICD-10-CM | POA: Diagnosis not present

## 2020-04-13 DIAGNOSIS — Z6830 Body mass index (BMI) 30.0-30.9, adult: Secondary | ICD-10-CM | POA: Diagnosis not present

## 2020-05-19 ENCOUNTER — Ambulatory Visit (INDEPENDENT_AMBULATORY_CARE_PROVIDER_SITE_OTHER): Payer: BC Managed Care – PPO | Admitting: Family Medicine

## 2020-05-19 ENCOUNTER — Encounter: Payer: Self-pay | Admitting: Family Medicine

## 2020-05-19 ENCOUNTER — Other Ambulatory Visit: Payer: Self-pay

## 2020-05-19 VITALS — BP 149/95 | HR 80 | Temp 98.7°F | Ht 60.0 in | Wt 158.0 lb

## 2020-05-19 DIAGNOSIS — F418 Other specified anxiety disorders: Secondary | ICD-10-CM

## 2020-05-19 MED ORDER — BUSPIRONE HCL 5 MG PO TABS: 5.0000 mg | ORAL_TABLET | Freq: Two times a day (BID) | ORAL | 1 refills | Status: DC

## 2020-05-19 NOTE — Progress Notes (Signed)
Subjective:  Patient ID: Ashley Gardner, female    DOB: 09-22-1981  Age: 39 y.o. MRN: [id number]  CC:  Chief Complaint  Patient presents with  . Depression    pt reports improvment with this condition it isn't as bad as it was 6 months ago.PHQ-9- 11  . Anxiety    pt reports improvment with this condition, but the pt dose still feels anxies alot. pt becomes easily overwelmed. GAD-7- 17    HPI Ashley Gardner presents for   Depression with anxiety: Last discussed in April. Did not tolerate Prozac.  Previously tolerated Wellbutrin but only took for 1 month.  Had not met with a therapist.  Life stressors were discussed last visit.  Had been off Toprol and still occasional palpitations with anxiety (previously better on Toprol).  Restarted Wellbutrin and Toprol last visit, phone numbers provided for counseling, and plan for increased exercise.  PHQ score is improved, similar GAD-7 score as below  Will be meeting with Zella Ball on June 21st.  Back on Wellbutrin 178m BID. No missed doses. No new side effects. Back on toprol.  Less palpitations, still some when overwhelmed.  Feels overwhelmed by mid morning every day.  No specific treatments when anxious. Feel like not enough time.  Started exercise daily, and sauna - that helped, but now that son is out of school - routine is off. too much to do feeling. Thinking about the next day at bedtime. Looking into camp for son.  Depression better. Breakdowns less time.  No SI/HI.    GAD 7 : Generalized Anxiety Score 05/19/2020 04/07/2020  Nervous, Anxious, on Edge 3 2  Control/stop worrying 2 3  Worry too much - different things 2 3  Trouble relaxing 3 3  Restless 3 2  Easily annoyed or irritable 3 3  Afraid - awful might happen 1 0  Total GAD 7 Score 17 16      [facility] 05/19/2020 04/07/2020 03/27/2019 07/29/2018 07/08/2018  Decreased Interest 1 2 0 0 0  Down, Depressed, Hopeless 1 3 0 0 0  PHQ - 2 Score 2 5 0 0 0    Altered sleeping 2 3 - - -  Tired, decreased energy 2 3 - - -  Change in appetite 1 3 - - -  Feeling bad or failure about yourself  0 2 - - -  Trouble concentrating 3 3 - - -  Moving slowly or fidgety/restless 1 1 - - -  Suicidal thoughts 0 0 - - -  PHQ-9 Score 11 20 - - -     History Patient Active Problem List   Diagnosis Date Noted  . Family history of early CAD 09/30/2017  . Elevated blood pressure reading 08/06/2017  . Generalized anxiety disorder 08/06/2017  . Insomnia 08/06/2017  . Current severe episode of major depressive disorder without psychotic features [place] prior episode [place]   Past Medical History:  Diagnosis Date  . Anxiety    Decreased concentration, Excessive worry, insomnia, irritability, nervous/anxious behavior,obessions(food) restlessness.  . Depression    Feelings of hopelessness, feeling of worthlessness  . Hyperlipidemia   . Hypersomnia   . Palpitations   . Restlessness    Past Surgical History:  Procedure Laterality Date  . LEFT MASS BIOPSY     Benign appearing fragments ofbone and fibrous tissue, deferential    Allergies  Allergen Reactions  . Penicillins Hives   Prior to Admission medications   Medication Sig Start Date  End Date Taking? Authorizing Provider  buPROPion (WELLBUTRIN SR) 150 MG 12 hr tablet Take 1 tablet (150 mg total) by mouth 2 (two) times daily. Start 1 po qd for 1st week. 04/07/20  Yes [name], MD  LO LOESTRIN FE 1 MG-10 MCG / 10 MCG tablet Take 1 tablet by mouth daily. 05/11/20  Yes [provider]  metoprolol succinate (TOPROL-XL) 25 MG 24 hr tablet Take 1 tablet (25 mg total) by mouth daily. 04/07/20  Yes [name], MD  Levonorgestrel (LILETTA, 52 MG,) 19.5 MCG/DAY IUD IUD by Intrauterine route.    [provider]   Social History   Socioeconomic History  . Marital status: Married    Spouse name: Not on file  . Number of children: Not on file  . Years of education:  Not on file  . Highest education level: Not on file  Occupational History  . Not on file  Tobacco Use  . Smoking status: Never Smoker  . Smokeless tobacco: Never Used  Substance and Sexual Activity  . Alcohol use: Yes  . Drug use: No  . Sexual activity: Not on file  Other Topics Concern  . Not on file  Social History Narrative  . Not on file   Social Determinants of Health   Financial Resource Strain:   . Difficulty of Paying Living Expenses:   Food Insecurity:   . Worried About Charity fundraiser in the Last Year:   . Arboriculturist in the Last Year:   Transportation Needs:   . Film/video editor (Medical):   Marland Kitchen Lack of Transportation (Non-Medical):   Physical Activity:   . Days of Exercise per Week:   . Minutes of Exercise per Session:   Stress:   . Feeling of Stress :   Social Connections:   . Frequency of Communication with Friends and Family:   . Frequency of Social Gatherings with Friends and Family:   . Attends Religious Services:   . Active Member of Clubs or Organizations:   . Attends Archivist Meetings:   Marland Kitchen Marital Status:   Intimate Partner Violence:   . Fear of Current or Ex-Partner:   . Emotionally Abused:   Marland Kitchen Physically Abused:   . Sexually Abused:     Review of Systems  Per hpi  Objective:   Vitals:   05/19/20 0947 05/19/20 0959  BP: (!) 145/87 (!) 149/95  Pulse: 80   Temp: 98.7 F (37.1 C)   TempSrc: Temporal   SpO2: 100%   Weight: 158 lb (71.7 kg)   Height: 5' (1.524 m)      Physical Exam Vitals reviewed.       Assessment & Plan:  Ashley Gardner is a 39 y.o. female . Depression with anxiety - Plan: busPIRone (BUSPAR) 5 MG tablet  Some improvement in depression symptoms, persistent anxiety symptoms.  Did have some improvement with exercise.  -Continue Wellbutrin, add BuSpar initially low-dose 5 mg twice daily with option of increasing doses next few weeks  -Restart exercise, home exercise if needed for  timing.  -Handout given on management of anxiety.  Plan to follow-up with psychologist next few weeks.  -Recommended writing out schedule for the following day earlier in the day to help with nighttime anxiety/thoughts of schedule.  51-monthrecheck with MyChart update next few weeks.  Meds ordered this encounter  Medications  . busPIRone (BUSPAR) 5 MG tablet    Sig: Take 1 tablet (5 mg  total) by mouth 2 (two) times daily.    Dispense:  60 tablet    Refill:  1   Patient Instructions   Continue wellbutrin same dose, add buspirone twice per day. Let me know how you are doing in 3 weeks - can decide on dose changes if needed.  See info below on anxiety management.  Try writing down schedule earlier in the day.  Exercise - home exercise if needed most days per week.  Return to the clinic or go to the nearest emergency room if any of your symptoms worsen or new symptoms occur. Take care.    Managing Anxiety, Adult After being diagnosed with an anxiety disorder, you may be relieved to know why you have felt or behaved a certain way. You may also feel overwhelmed about the treatment ahead and what it will mean for your life. With care and support, you can manage this condition and recover from it. How to manage lifestyle changes Managing stress and anxiety  Stress is your body's reaction to life changes and events, both good and bad. Most stress will last just a few hours, but stress can be ongoing and can lead to more than just stress. Although stress can play a major role in anxiety, it is not the same as anxiety. Stress is usually caused by something external, such as a deadline, test, or competition. Stress normally passes after the triggering event has ended.  Anxiety is caused by something internal, such as imagining a terrible outcome or worrying that something will go wrong that will devastate you. Anxiety often does not go away even after the triggering event is over, and it can become  long-term (chronic) worry. It is important to understand the differences between stress and anxiety and to manage your stress effectively so that it does not lead to an anxious response. Talk with your health care provider or a counselor to learn more about reducing anxiety and stress. He or she may suggest tension reduction techniques, such as:  Music therapy. This can include creating or listening to music that you enjoy and that inspires you.  Mindfulness-based meditation. This involves being aware of your normal breaths while not trying to control your breathing. It can be done while sitting or walking.  Centering prayer. This involves focusing on a word, phrase, or sacred image that means something to you and brings you peace.  Deep breathing. To do this, expand your stomach and inhale slowly through your nose. Hold your breath for 3-5 seconds. Then exhale slowly, letting your stomach muscles relax.  Self-talk. This involves identifying thought patterns that lead to anxiety reactions and changing those patterns.  Muscle relaxation. This involves tensing muscles and then relaxing them. Choose a tension reduction technique that suits your lifestyle and personality. These techniques take time and practice. Set aside 5-15 minutes a day to do them. Therapists can offer counseling and training in these techniques. The training to help with anxiety may be covered by some insurance plans. Other things you can do to manage stress and anxiety include:  Keeping a stress/anxiety diary. This can help you learn what triggers your reaction and then learn ways to manage your response.  Thinking about how you react to certain situations. You may not be able to control everything, but you can control your response.  Making time for activities that help you relax and not feeling guilty about spending your time in this way.  Visual imagery and yoga can help you stay  calm and relax.  Medicines Medicines can  help ease symptoms. Medicines for anxiety include:  Anti-anxiety drugs.  Antidepressants. Medicines are often used as a primary treatment for anxiety disorder. Medicines will be prescribed by a health care provider. When used together, medicines, psychotherapy, and tension reduction techniques may be the most effective treatment. Relationships Relationships can play a big part in helping you recover. Try to spend more time connecting with trusted friends and family members. Consider going to couples counseling, taking family education classes, or going to family therapy. Therapy can help you and others better understand your condition. How to recognize changes in your anxiety Everyone responds differently to treatment for anxiety. Recovery from anxiety happens when symptoms decrease and stop interfering with your daily activities at home or work. This may mean that you will start to:  Have better concentration and focus. Worry will interfere less in your daily thinking.  Sleep better.  Be less irritable.  Have more energy.  Have improved memory. It is important to recognize when your condition is getting worse. Contact your health care provider if your symptoms interfere with home or work and you feel like your condition is not improving. Follow these instructions at home: Activity  Exercise. Most adults should do the following: ? Exercise for at least 150 minutes each week. The exercise should increase your heart rate and make you sweat (moderate-intensity exercise). ? Strengthening exercises at least twice a week.  Get the right amount and quality of sleep. Most adults need 7-9 hours of sleep each night. Lifestyle   Eat a healthy diet that includes plenty of vegetables, fruits, whole grains, low-fat dairy products, and lean protein. Do not eat a lot of foods that are high in solid fats, added sugars, or salt.  Make choices that simplify your life.  Do not use any products that  contain nicotine or tobacco, such as cigarettes, e-cigarettes, and chewing tobacco. If you need help quitting, ask your health care provider.  Avoid caffeine, alcohol, and certain over-the-counter cold medicines. These may make you feel worse. Ask your pharmacist which medicines to avoid. General instructions  Take over-the-counter and prescription medicines only as told by your health care provider.  Keep all follow-up visits as told by your health care provider. This is important. Where to find support You can get help and support from these sources:  Self-help groups.  Online and [employer].  A trusted spiritual leader.  Couples counseling.  Family education classes.  Family therapy. Where to find more information You may find that joining a support group helps you deal with your anxiety. The following sources can help you locate counselors or support groups near you:  [place]: www.mentalhealthamerica.net  Anxiety and Depression Association of [place] (ADAA): https://www.clark.net/  National Alliance on Mental Illness (NAMI): www.nami.org Contact a health care provider if you:  Have a hard time staying focused or finishing daily tasks.  Spend many hours a day feeling worried about everyday life.  Become exhausted by worry.  Start to have headaches, feel tense, or have nausea.  Urinate more than normal.  Have diarrhea. Get help right away if you have:  A racing heart and shortness of breath.  Thoughts of hurting yourself or others. If you ever feel like you may hurt yourself or others, or have thoughts about taking your own life, get help right away. You can go to your nearest emergency department or call:  Your local emergency services (911 in the [place]  A suicide crisis helpline, such as the [place] at 580-804-8108. This is open 24 hours a day. Summary  Taking steps to learn and use tension reduction  techniques can help calm you and help prevent triggering an anxiety reaction.  When used together, medicines, psychotherapy, and tension reduction techniques may be the most effective treatment.  Family, friends, and partners can play a big part in helping you recover from an anxiety disorder. This information is not intended to replace advice given to you by your health care provider. Make sure you discuss any questions you have with your health care provider. Document Revised: 04/28/2019 Document Reviewed: 04/28/2019 Elsevier Patient Education  [employer].   If you have lab work done today you will be contacted with your lab results within the next 2 weeks.  If you have not heard from [place] then please contact us. The fastest way to get your results is to register for My Chart.   IF you received an x-ray today, you will receive an invoice from [facility]. Please contact [facility] at [phone] with questions or concerns regarding your invoice.   IF you received labwork today, you will receive an invoice from [facility]. Please contact [facility] at [phone] with questions or concerns regarding your invoice.   Our billing staff will not be able to assist you with questions regarding bills from these companies.  You will be contacted with the lab results as soon as they are available. The fastest way to get your results is to activate your My Chart account. Instructions are located on the last page of this paperwork. If you have not heard from [place] regarding the results in 2 weeks, please contact this office.         Signed, Merri Ray, MD [facility]

## 2020-05-19 NOTE — Patient Instructions (Addendum)
Continue wellbutrin same dose, add buspirone twice per day. Let me know how you are doing in 3 weeks - can decide on dose changes if needed.  See info below on anxiety management.  Try writing down schedule earlier in the day.  Exercise - home exercise if needed most days per week.  Return to the clinic or go to the nearest emergency room if any of your symptoms worsen or new symptoms occur. Take care.    Managing Anxiety, Adult After being diagnosed with an anxiety disorder, you may be relieved to know why you have felt or behaved a certain way. You may also feel overwhelmed about the treatment ahead and what it will mean for your life. With care and support, you can manage this condition and recover from it. How to manage lifestyle changes Managing stress and anxiety  Stress is your body's reaction to life changes and events, both good and bad. Most stress will last just a few hours, but stress can be ongoing and can lead to more than just stress. Although stress can play a major role in anxiety, it is not the same as anxiety. Stress is usually caused by something external, such as a deadline, test, or competition. Stress normally passes after the triggering event has ended.  Anxiety is caused by something internal, such as imagining a terrible outcome or worrying that something will go wrong that will devastate you. Anxiety often does not go away even after the triggering event is over, and it can become long-term (chronic) worry. It is important to understand the differences between stress and anxiety and to manage your stress effectively so that it does not lead to an anxious response. Talk with your health care provider or a counselor to learn more about reducing anxiety and stress. He or she may suggest tension reduction techniques, such as:  Music therapy. This can include creating or listening to music that you enjoy and that inspires you.  Mindfulness-based meditation. This involves  being aware of your normal breaths while not trying to control your breathing. It can be done while sitting or walking.  Centering prayer. This involves focusing on a word, phrase, or sacred image that means something to you and brings you peace.  Deep breathing. To do this, expand your stomach and inhale slowly through your nose. Hold your breath for 3-5 seconds. Then exhale slowly, letting your stomach muscles relax.  Self-talk. This involves identifying thought patterns that lead to anxiety reactions and changing those patterns.  Muscle relaxation. This involves tensing muscles and then relaxing them. Choose a tension reduction technique that suits your lifestyle and personality. These techniques take time and practice. Set aside 5-15 minutes a day to do them. Therapists can offer counseling and training in these techniques. The training to help with anxiety may be covered by some insurance plans. Other things you can do to manage stress and anxiety include:  Keeping a stress/anxiety diary. This can help you learn what triggers your reaction and then learn ways to manage your response.  Thinking about how you react to certain situations. You may not be able to control everything, but you can control your response.  Making time for activities that help you relax and not feeling guilty about spending your time in this way.  Visual imagery and yoga can help you stay calm and relax.  Medicines Medicines can help ease symptoms. Medicines for anxiety include:  Anti-anxiety drugs.  Antidepressants. Medicines are often used as a primary  treatment for anxiety disorder. Medicines will be prescribed by a health care provider. When used together, medicines, psychotherapy, and tension reduction techniques may be the most effective treatment. Relationships Relationships can play a big part in helping you recover. Try to spend more time connecting with trusted friends and family members. Consider  going to couples counseling, taking family education classes, or going to family therapy. Therapy can help you and others better understand your condition. How to recognize changes in your anxiety Everyone responds differently to treatment for anxiety. Recovery from anxiety happens when symptoms decrease and stop interfering with your daily activities at home or work. This may mean that you will start to:  Have better concentration and focus. Worry will interfere less in your daily thinking.  Sleep better.  Be less irritable.  Have more energy.  Have improved memory. It is important to recognize when your condition is getting worse. Contact your health care provider if your symptoms interfere with home or work and you feel like your condition is not improving. Follow these instructions at home: Activity  Exercise. Most adults should do the following: ? Exercise for at least 150 minutes each week. The exercise should increase your heart rate and make you sweat (moderate-intensity exercise). ? Strengthening exercises at least twice a week.  Get the right amount and quality of sleep. Most adults need 7-9 hours of sleep each night. Lifestyle   Eat a healthy diet that includes plenty of vegetables, fruits, whole grains, low-fat dairy products, and lean protein. Do not eat a lot of foods that are high in solid fats, added sugars, or salt.  Make choices that simplify your life.  Do not use any products that contain nicotine or tobacco, such as cigarettes, e-cigarettes, and chewing tobacco. If you need help quitting, ask your health care provider.  Avoid caffeine, alcohol, and certain over-the-counter cold medicines. These may make you feel worse. Ask your pharmacist which medicines to avoid. General instructions  Take over-the-counter and prescription medicines only as told by your health care provider.  Keep all follow-up visits as told by your health care provider. This is  important. Where to find support You can get help and support from these sources:  Self-help groups.  Online and [employer].  A trusted spiritual leader.  Couples counseling.  Family education classes.  Family therapy. Where to find more information You may find that joining a support group helps you deal with your anxiety. The following sources can help you locate counselors or support groups near you:  Mental Health [place]: www.mentalhealthamerica.net  Anxiety and Depression Association of [place] (ADAA): ProgramCam.de  [employer] on Mental Illness (NAMI): www.nami.org Contact a health care provider if you:  Have a hard time staying focused or finishing daily tasks.  Spend many hours a day feeling worried about everyday life.  Become exhausted by worry.  Start to have headaches, feel tense, or have nausea.  Urinate more than normal.  Have diarrhea. Get help right away if you have:  A racing heart and shortness of breath.  Thoughts of hurting yourself or others. If you ever feel like you may hurt yourself or others, or have thoughts about taking your own life, get help right away. You can go to your nearest emergency department or call:  Your local emergency services (911 in the [place]  A suicide crisis helpline, such as the National Suicide Prevention Lifeline at (703) 613-8108. This is open 24 hours a day. Summary  Taking steps to learn  and use tension reduction techniques can help calm you and help prevent triggering an anxiety reaction.  When used together, medicines, psychotherapy, and tension reduction techniques may be the most effective treatment.  Family, friends, and partners can play a big part in helping you recover from an anxiety disorder. This information is not intended to replace advice given to you by your health care provider. Make sure you discuss any questions you have with your health care provider. Document Revised:  04/28/2019 Document Reviewed: 04/28/2019 Elsevier Patient Education  [employer].   If you have lab work done today you will be contacted with your lab results within the next 2 weeks.  If you have not heard from [place] then please contact us. The fastest way to get your results is to register for My Chart.   IF you received an x-ray today, you will receive an invoice from [facility]. Please contact [facility] at [phone] with questions or concerns regarding your invoice.   IF you received labwork today, you will receive an invoice from [place]. Please contact [facility] at [phone] with questions or concerns regarding your invoice.   Our billing staff will not be able to assist you with questions regarding bills from these companies.  You will be contacted with the lab results as soon as they are available. The fastest way to get your results is to activate your My Chart account. Instructions are located on the last page of this paperwork. If you have not heard from [place] regarding the results in 2 weeks, please contact this office.

## 2020-05-30 DIAGNOSIS — F331 Major depressive disorder, recurrent, moderate: Secondary | ICD-10-CM | POA: Diagnosis not present

## 2020-06-15 ENCOUNTER — Encounter: Payer: Self-pay | Admitting: Family Medicine

## 2020-06-15 DIAGNOSIS — F331 Major depressive disorder, recurrent, moderate: Secondary | ICD-10-CM | POA: Diagnosis not present

## 2020-06-29 ENCOUNTER — Other Ambulatory Visit: Payer: Self-pay | Admitting: Family Medicine

## 2020-06-29 DIAGNOSIS — F418 Other specified anxiety disorders: Secondary | ICD-10-CM

## 2020-06-29 NOTE — Telephone Encounter (Signed)
Requested Prescriptions  Pending Prescriptions Disp Refills  . buPROPion (WELLBUTRIN SR) 150 MG 12 hr tablet [Pharmacy Med Name: BUPROPION HCL SR 150 MG TABLET] 180 tablet 1    Sig: TAKE 1 TABLET (150 MG TOTAL) BY MOUTH 2 (TWO) TIMES DAILY. START WITH 1 TAB ONCE A DAY FOR 1ST WEEK.     Psychiatry: Antidepressants - bupropion Failed - 06/29/2020  1:37 AM      Failed - Last BP in normal range    BP Readings from Last 1 Encounters:  05/19/20 (!) 149/95         Passed - Completed PHQ-2 or PHQ-9 in the last 360 days.      Passed - Valid encounter within last 6 months    Recent Outpatient Visits          1 month ago Depression with anxiety   [facility], Asencion Partridge, MD   2 months ago Depression with anxiety   [facility], Asencion Partridge, MD   1 year ago Depression with anxiety   [facility], Asencion Partridge, MD   1 year ago Palpitations   [facility], Asencion Partridge, MD   1 year ago Tingling sensation in face   [facility], Asencion Partridge, MD

## 2020-07-14 ENCOUNTER — Other Ambulatory Visit: Payer: Self-pay | Admitting: Family Medicine

## 2020-07-14 ENCOUNTER — Other Ambulatory Visit: Payer: Self-pay | Admitting: Emergency Medicine

## 2020-07-14 DIAGNOSIS — F418 Other specified anxiety disorders: Secondary | ICD-10-CM

## 2020-07-14 MED ORDER — BUSPIRONE HCL 5 MG PO TABS: 5.0000 mg | ORAL_TABLET | Freq: Two times a day (BID) | ORAL | 1 refills | Status: DC

## 2020-07-14 NOTE — Telephone Encounter (Signed)
° °  Notes to clinic:  REQUEST FOR 90 DAYS PRESCRIPTION. DX Code Needed.   Requested Prescriptions  Pending Prescriptions Disp Refills   busPIRone (BUSPAR) 5 MG tablet [Pharmacy Med Name: BUSPIRONE HCL 5 MG TABLET] 180 tablet 1    Sig: TAKE 1 TABLET BY MOUTH TWICE A DAY      Psychiatry: Anxiolytics/Hypnotics - Non-controlled Passed - 07/14/2020  2:32 PM      Passed - Valid encounter within last 6 months    Recent Outpatient Visits           1 month ago Depression with anxiety   [facility], Asencion Partridge, MD   3 months ago Depression with anxiety   [facility], Asencion Partridge, MD   1 year ago Depression with anxiety   [facility], Asencion Partridge, MD   1 year ago Palpitations   [facility], Asencion Partridge, MD   2 years ago Tingling sensation in face   [facility], Asencion Partridge, MD

## 2020-08-02 DIAGNOSIS — F329 Major depressive disorder, single episode, unspecified: Secondary | ICD-10-CM | POA: Diagnosis not present

## 2020-08-02 DIAGNOSIS — F902 Attention-deficit hyperactivity disorder, combined type: Secondary | ICD-10-CM | POA: Diagnosis not present

## 2020-08-02 DIAGNOSIS — F411 Generalized anxiety disorder: Secondary | ICD-10-CM | POA: Diagnosis not present

## 2020-09-09 DIAGNOSIS — F988 Other specified behavioral and emotional disorders with onset usually occurring in childhood and adolescence: Secondary | ICD-10-CM | POA: Insufficient documentation

## 2020-09-13 ENCOUNTER — Encounter: Payer: Self-pay | Admitting: Family Medicine

## 2020-09-13 DIAGNOSIS — F329 Major depressive disorder, single episode, unspecified: Secondary | ICD-10-CM | POA: Diagnosis not present

## 2020-09-13 DIAGNOSIS — F902 Attention-deficit hyperactivity disorder, combined type: Secondary | ICD-10-CM | POA: Diagnosis not present

## 2020-09-13 DIAGNOSIS — F411 Generalized anxiety disorder: Secondary | ICD-10-CM | POA: Diagnosis not present

## 2020-09-20 NOTE — Telephone Encounter (Signed)
Pt diagnosed with ADHD by [name] and she is requesting medication or other treatment as she believes this may help. Please advise. No notes from Maralyn Sago at this time

## 2020-09-20 NOTE — Telephone Encounter (Signed)
I do have that information - it is in my Inbox in back office. We certainly can discuss medication but need to do that with a visit to review meds, risks, etc.  That can certainly be a virtual if she prefers. Any time that works for her that I have available is fine. Thanks.

## 2020-09-30 ENCOUNTER — Other Ambulatory Visit: Payer: Self-pay | Admitting: Family Medicine

## 2020-09-30 DIAGNOSIS — Z87898 Personal history of other specified conditions: Secondary | ICD-10-CM

## 2020-10-14 ENCOUNTER — Other Ambulatory Visit: Payer: Self-pay

## 2020-10-14 ENCOUNTER — Ambulatory Visit (INDEPENDENT_AMBULATORY_CARE_PROVIDER_SITE_OTHER): Payer: BC Managed Care – PPO | Admitting: Family Medicine

## 2020-10-14 ENCOUNTER — Encounter: Payer: Self-pay | Admitting: Family Medicine

## 2020-10-14 VITALS — BP 135/87 | HR 74 | Temp 98.4°F | Ht 60.0 in | Wt 158.4 lb

## 2020-10-14 DIAGNOSIS — F418 Other specified anxiety disorders: Secondary | ICD-10-CM | POA: Diagnosis not present

## 2020-10-14 DIAGNOSIS — Z5181 Encounter for therapeutic drug level monitoring: Secondary | ICD-10-CM | POA: Diagnosis not present

## 2020-10-14 DIAGNOSIS — F902 Attention-deficit hyperactivity disorder, combined type: Secondary | ICD-10-CM

## 2020-10-14 MED ORDER — AMPHETAMINE-DEXTROAMPHET ER 20 MG PO CP24: 20.0000 mg | ORAL_CAPSULE | ORAL | 0 refills | Status: DC

## 2020-10-14 MED ORDER — BUSPIRONE HCL 7.5 MG PO TABS: 7.5000 mg | ORAL_TABLET | Freq: Two times a day (BID) | ORAL | 5 refills | Status: DC

## 2020-10-14 NOTE — Progress Notes (Signed)
Subjective:  Patient ID: Ashley Gardner, female    DOB: 12/30/1980  Age: 39 y.o. MRN: [id number]  CC:  Chief Complaint  Patient presents with  . Medical Management of Chronic Issues    f/u   . Anxiety  . Depression  . ADHD    New Dx would like to try meds to help with it     HPI [name] presents for   Depression with anxiety: Last eval June 10.  Continued Wellbutrin at that time and added BuSpar initially 5 mg twice daily.  With option of increased dosing.  Had follow-up with therapist [name]. Since that time she has also undergone evaluation and testing for ADHD.  [name].  Assessment on October 3rd.  Symptoms were consistent with attention deficit hyperactivity disorder combined presentation.  Also consistent with symptoms of depression.  Anxiety likely related to symptoms of ADHD and mood. Copy of assessment to be scanned to media.  Son also with ADHD, she sees some of her in him. Distractible/focus issues all day.  On Buspar 5mg  BID - did not try 3 times. Feels min change from that at low dose.  Still on Wellbutrin. Has not seen since ADD assessment. Does like meeting with Marcelino Duster.   No prior ADD meds.  Son on methylphenidate.  Alcohol - rare.  No marijuana, no IDU. No prior hx of addiction.    GAD 7 : Generalized Anxiety Score 10/14/2020 05/19/2020 04/07/2020  Nervous, Anxious, on Edge 2 3 2   Control/stop worrying 1 2 3   Worry too much - different things 1 2 3   Trouble relaxing 1 3 3   Restless 3 3 2   Easily annoyed or irritable 2 3 3   Afraid - awful might happen 0 1 0  Total GAD 7 Score 10 17 16   Anxiety Difficulty Extremely difficult - -      Depression screen [facility] 2/9 10/14/2020 05/19/2020 04/07/2020 03/27/2019 07/29/2018  Decreased Interest 1 1 2  0 0  Down, Depressed, Hopeless 1 1 3  0 0  PHQ - 2 Score 2 2 5  0 0  Altered sleeping 0 2 3 - -  Tired, decreased energy 3 2 3  - -  Change in appetite 3 1 3  - -  Feeling bad or failure about  yourself  1 0 2 - -  Trouble concentrating 3 3 3  - -  Moving slowly or fidgety/restless 0 1 1 - -  Suicidal thoughts 0 0 0 - -  PHQ-9 Score 12 11 20  - -  Difficult doing work/chores Extremely dIfficult - - - -      History Patient Active Problem List   Diagnosis Date Noted  . Family history of early CAD 09/30/2017  . Elevated blood pressure reading 08/06/2017  . Generalized anxiety disorder 08/06/2017  . Insomnia 08/06/2017  . Current severe episode of major depressive disorder without psychotic features without prior episode (HCC) 08/06/2017   Past Medical History:  Diagnosis Date  . Anxiety    Decreased concentration, Excessive worry, insomnia, irritability, nervous/anxious behavior,obessions(food) restlessness.  . Depression    Feelings of hopelessness, feeling of worthlessness  . Hyperlipidemia   . Hypersomnia   . Palpitations   . Restlessness    Past Surgical History:  Procedure Laterality Date  . LEFT MASS BIOPSY     Benign appearing fragments ofbone and fibrous tissue, deferential    Allergies  Allergen Reactions  . Penicillins Hives   Prior to Admission medications   Medication Sig  Start Date End Date Taking? Authorizing Provider  buPROPion (WELLBUTRIN SR) 150 MG 12 hr tablet TAKE 1 TABLET (150 MG TOTAL) BY MOUTH 2 (TWO) TIMES DAILY. START WITH 1 TAB ONCE A DAY FOR 1ST WEEK. 06/29/20  Yes Shade Flood, MD  busPIRone (BUSPAR) 5 MG tablet Take 1 tablet (5 mg total) by mouth 2 (two) times daily. 07/14/20  Yes Shade Flood, MD  LO LOESTRIN FE 1 MG-10 MCG / 10 MCG tablet Take 1 tablet by mouth daily. 05/11/20  Yes [provider]  metoprolol succinate (TOPROL-XL) 25 MG 24 hr tablet TAKE 1 TABLET BY MOUTH EVERY DAY 09/30/20  Yes Shade Flood, MD   Social History   Socioeconomic History  . Marital status: Married    Spouse name: Not on file  . Number of children: Not on file  . Years of education: Not on file  . Highest education level: Not  on file  Occupational History  . Not on file  Tobacco Use  . Smoking status: Never Smoker  . Smokeless tobacco: Never Used  Substance and Sexual Activity  . Alcohol use: Yes  . Drug use: No  . Sexual activity: Not on file  Other Topics Concern  . Not on file  Social History Narrative  . Not on file   Social Determinants of Health   Financial Resource Strain:   . Difficulty of Paying Living Expenses: Not on file  Food Insecurity:   . Worried About Programme researcher, broadcasting/film/video in the Last Year: Not on file  . Ran Out of Food in the Last Year: Not on file  Transportation Needs:   . Lack of Transportation (Medical): Not on file  . Lack of Transportation (Non-Medical): Not on file  Physical Activity:   . Days of Exercise per Week: Not on file  . Minutes of Exercise per Session: Not on file  Stress:   . Feeling of Stress : Not on file  Social Connections:   . Frequency of Communication with Friends and Family: Not on file  . Frequency of Social Gatherings with Friends and Family: Not on file  . Attends Religious Services: Not on file  . Active Member of Clubs or Organizations: Not on file  . Attends Banker Meetings: Not on file  . Marital Status: Not on file  Intimate Partner Violence:   . Fear of Current or Ex-Partner: Not on file  . Emotionally Abused: Not on file  . Physically Abused: Not on file  . Sexually Abused: Not on file    Review of Systems Per HPI.   Objective:   Vitals:   10/14/20 0938  BP: 135/87  Pulse: 74  Temp: 98.4 F (36.9 C)  TempSrc: Temporal  SpO2: 100%  Weight: 158 lb 6.4 oz (71.8 kg)  Height: 5' (1.524 m)     Physical Exam Vitals reviewed.  Constitutional:      Appearance: She is well-developed.  HENT:     Head: Normocephalic and atraumatic.  Eyes:     Conjunctiva/sclera: Conjunctivae normal.     Pupils: Pupils are equal, round, and reactive to light.  Neck:     Vascular: No carotid bruit.     Comments: No  thyromegaly/nodule.  Cardiovascular:     Rate and Rhythm: Normal rate and regular rhythm.     Heart sounds: Normal heart sounds.  Pulmonary:     Effort: Pulmonary effort is normal.     Breath sounds: Normal breath sounds.  Abdominal:     Palpations: Abdomen is soft. There is no pulsatile mass.     Tenderness: There is no abdominal tenderness.  Skin:    General: Skin is warm and dry.  Neurological:     Mental Status: She is alert and oriented to person, place, and time.  Psychiatric:        Mood and Affect: Mood normal.        Behavior: Behavior normal.        Thought Content: Thought content normal.     Assessment & Plan:  MYKELA MEWBORN is a 39 y.o. female . Depression with anxiety - Plan: busPIRone (BUSPAR) 7.5 MG tablet  Attention deficit hyperactivity disorder (ADHD), combined type - Plan: amphetamine-dextroamphetamine (ADDERALL XR) 20 MG 24 hr capsule, ToxASSURE Select 13 (MW), Urine  Medication monitoring encounter - Plan: ToxASSURE Select 13 (MW), Urine  Suspected combination of depression, anxiety with new diagnosis of ADHD.  Some of her anxiety symptoms may be related to untreated ADHD.  Will start Adderall XR 20 mg daily, option for immediate release if cost prohibitive.  Possible side effects and risks were discussed.  Recheck in 1 month.  Try higher dose of BuSpar 7.5 mg twice daily, continue counseling.  RTC precautions.   Meds ordered this encounter  Medications  . busPIRone (BUSPAR) 7.5 MG tablet    Sig: Take 1 tablet (7.5 mg total) by mouth 2 (two) times daily.    Dispense:  60 tablet    Refill:  5  . amphetamine-dextroamphetamine (ADDERALL XR) 20 MG 24 hr capsule    Sig: Take 1 capsule (20 mg total) by mouth every morning.    Dispense:  30 capsule    Refill:  0   Patient Instructions   Try higher dose of BuSpar twice per day.  Let me know how that dose is working, but we do have option to increase the dose further.  Start Adderall XR 20 mg once per day in  the morning.  Follow-up with me in 1 month but let me know if there are questions prior to that time.  Thank you for coming in today.    If you have lab work done today you will be contacted with your lab results within the next 2 weeks.  If you have not heard from [place] then please contact us. The fastest way to get your results is to register for My Chart.   IF you received an x-ray today, you will receive an invoice from [facility]. Please contact [facility] at [phone] with questions or concerns regarding your invoice.   IF you received labwork today, you will receive an invoice from [facility]. Please contact [facility] at [phone] with questions or concerns regarding your invoice.   Our billing staff will not be able to assist you with questions regarding bills from these companies.  You will be contacted with the lab results as soon as they are available. The fastest way to get your results is to activate your My Chart account. Instructions are located on the last page of this paperwork. If you have not heard from [place] regarding the results in 2 weeks, please contact this office.          Signed, Meredith Staggers, MD [facility]

## 2020-10-14 NOTE — Patient Instructions (Addendum)
Try higher dose of BuSpar twice per day.  Let me know how that dose is working, but we do have option to increase the dose further.  Start Adderall XR 20 mg once per day in the morning.  Follow-up with me in 1 month but let me know if there are questions prior to that time.  Thank you for coming in today.    If you have lab work done today you will be contacted with your lab results within the next 2 weeks.  If you have not heard from [place] then please contact us. The fastest way to get your results is to register for My Chart.   IF you received an x-ray today, you will receive an invoice from [facility]. Please contact [facility] at [phone] with questions or concerns regarding your invoice.   IF you received labwork today, you will receive an invoice from [facility]. Please contact [facility] at [phone] with questions or concerns regarding your invoice.   Our billing staff will not be able to assist you with questions regarding bills from these companies.  You will be contacted with the lab results as soon as they are available. The fastest way to get your results is to activate your My Chart account. Instructions are located on the last page of this paperwork. If you have not heard from [place] regarding the results in 2 weeks, please contact this office.

## 2020-10-18 LAB — TOXASSURE SELECT 13 (MW), URINE

## 2020-11-11 ENCOUNTER — Encounter: Payer: Self-pay | Admitting: Family Medicine

## 2020-11-11 ENCOUNTER — Telehealth (INDEPENDENT_AMBULATORY_CARE_PROVIDER_SITE_OTHER): Payer: BC Managed Care – PPO | Admitting: Family Medicine

## 2020-11-11 VITALS — Ht 60.0 in | Wt 150.2 lb

## 2020-11-11 DIAGNOSIS — F902 Attention-deficit hyperactivity disorder, combined type: Secondary | ICD-10-CM

## 2020-11-11 DIAGNOSIS — F418 Other specified anxiety disorders: Secondary | ICD-10-CM

## 2020-11-11 MED ORDER — BUSPIRONE HCL 10 MG PO TABS: 10.0000 mg | ORAL_TABLET | Freq: Two times a day (BID) | ORAL | 1 refills | Status: DC

## 2020-11-11 MED ORDER — AMPHETAMINE-DEXTROAMPHET ER 30 MG PO CP24: 30.0000 mg | ORAL_CAPSULE | ORAL | 0 refills | Status: DC

## 2020-11-11 NOTE — Progress Notes (Signed)
Virtual Visit via Video Note  I connected with Ashley Gardner on 11/11/20 at 9:51 AM by a video enabled telemedicine application and verified that I am speaking with the correct person using two identifiers.  Patient location: home My location: office   I discussed the limitations, risks, security and privacy concerns of performing an evaluation and management service by telephone and the availability of in person appointments. I also discussed with the patient that there may be a patient responsible charge related to this service. The patient expressed understanding and agreed to proceed, consent obtained  Chief complaint:  Chief Complaint  Patient presents with  . Follow-up    on anxiety and ADHD. Pt reports some improvement with her anxiety, and some slight change with her ADHD. Pt reports she still is all over the place, and that she reallly isn't focusing like she should.     History of Present Illness: Ashley Gardner is a 39 y.o. female   Depression with anxiety, attention deficit disorder. Follow-up from November 5 visit.  Has undergone counseling for anxiety, depression, has been treated with Wellbutrin and BuSpar.  Had evaluation for ADHD, formal assessment was October 3, symptoms were consistent with attention deficit hyperactivity disorder.  Also consistent with depression.  Anxiety likely related to symptoms of ADHD and mood.  She was taking BuSpar 5 mg twice daily last visit, had not tried increased dose.  Still on Wellbutrin same dose.  GAD-7 score with 10 at that time, decreased from 17 in June.  PHQ of 12.  BuSpar was increased to 7.5 mg twice daily, started on Adderall XR 20 mg every morning.  Anxiety symptoms a little better. Still on buspar 5mg , 7.5 was not approved - needs prior authorization.  Taking buspar 5mg  tid. No new side effects. Some missed doses, once or twice per week - setting alarm.  Would like to try 10mg  dose BID.  Still taking Wellbutrin 150 mg twice  per day.  No new side effects  adderall XR - notices a little difference, but still all over the place, trouble with focus. Can focus about .  Denies palpitations, significant appetite change or sleep onset difficulty. Wakes up at 1am at times, taking melatonin - no problem with deep sleep.   GAD 7 : Generalized Anxiety Score 11/11/2020 10/14/2020 05/19/2020 04/07/2020  Nervous, Anxious, on Edge 2 2 3 2   Control/stop worrying 2 1 2 3   Worry too much - different things 2 1 2 3   Trouble relaxing 3 1 3 3   Restless 2 3 3 2   Easily annoyed or irritable 2 2 3 3   Afraid - awful might happen 0 0 1 0  Total GAD 7 Score 13 10 17 16   Anxiety Difficulty - Extremely difficult - -    Depression screen [facility] 2/9 11/11/2020 10/14/2020 05/19/2020 04/07/2020 03/27/2019  Decreased Interest 3 1 1 2  0  Down, Depressed, Hopeless 1 1 1 3  0  PHQ - 2 Score 4 2 2 5  0  Altered sleeping 3 0 2 3 -  Tired, decreased energy 3 3 2 3  -  Change in appetite 0 3 1 3  -  Feeling bad or failure about yourself  3 1 0 2 -  Trouble concentrating 3 3 3 3  -  Moving slowly or fidgety/restless 2 0 1 1 -  Suicidal thoughts 0 0 0 0 -  PHQ-9 Score 18 12 11 20  -  Difficult doing work/chores - Extremely dIfficult - - -  Patient Active Problem List   Diagnosis Date Noted  . Family history of early CAD 09/30/2017  . Elevated blood pressure reading 08/06/2017  . Generalized anxiety disorder 08/06/2017  . Insomnia 08/06/2017  . Current severe episode of major depressive disorder without psychotic features without prior episode (HCC) 08/06/2017   Past Medical History:  Diagnosis Date  . Anxiety    Decreased concentration, Excessive worry, insomnia, irritability, nervous/anxious behavior,obessions(food) restlessness.  . Depression    Feelings of hopelessness, feeling of worthlessness  . Hyperlipidemia   . Hypersomnia   . Palpitations   . Restlessness    Past Surgical History:  Procedure Laterality Date  . LEFT MASS  BIOPSY     Benign appearing fragments ofbone and fibrous tissue, deferential    Allergies  Allergen Reactions  . Penicillins Hives   Prior to Admission medications   Medication Sig Start Date End Date Taking? Authorizing Provider  amphetamine-dextroamphetamine (ADDERALL XR) 20 MG 24 hr capsule Take 1 capsule (20 mg total) by mouth every morning. 10/14/20  Yes Shade Flood, MD  buPROPion (WELLBUTRIN SR) 150 MG 12 hr tablet TAKE 1 TABLET (150 MG TOTAL) BY MOUTH 2 (TWO) TIMES DAILY. START WITH 1 TAB ONCE A DAY FOR 1ST WEEK. 06/29/20  Yes Shade Flood, MD  busPIRone (BUSPAR) 7.5 MG tablet Take 1 tablet (7.5 mg total) by mouth 2 (two) times daily. 10/14/20  Yes Shade Flood, MD  LO LOESTRIN FE 1 MG-10 MCG / 10 MCG tablet Take 1 tablet by mouth daily. 05/11/20  Yes [provider]  metoprolol succinate (TOPROL-XL) 25 MG 24 hr tablet TAKE 1 TABLET BY MOUTH EVERY DAY 09/30/20  Yes Shade Flood, MD   Social History   Socioeconomic History  . Marital status: Married    Spouse name: Not on file  . Number of children: Not on file  . Years of education: Not on file  . Highest education level: Not on file  Occupational History  . Not on file  Tobacco Use  . Smoking status: Never Smoker  . Smokeless tobacco: Never Used  Substance and Sexual Activity  . Alcohol use: Yes  . Drug use: No  . Sexual activity: Not on file  Other Topics Concern  . Not on file  Social History Narrative  . Not on file   Social Determinants of Health   Financial Resource Strain:   . Difficulty of Paying Living Expenses: Not on file  Food Insecurity:   . Worried About Programme researcher, broadcasting/film/video in the Last Year: Not on file  . Ran Out of Food in the Last Year: Not on file  Transportation Needs:   . Lack of Transportation (Medical): Not on file  . Lack of Transportation (Non-Medical): Not on file  Physical Activity:   . Days of Exercise per Week: Not on file  . Minutes of Exercise per  Session: Not on file  Stress:   . Feeling of Stress : Not on file  Social Connections:   . Frequency of Communication with Friends and Family: Not on file  . Frequency of Social Gatherings with Friends and Family: Not on file  . Attends Religious Services: Not on file  . Active Member of Clubs or Organizations: Not on file  . Attends Banker Meetings: Not on file  . Marital Status: Not on file  Intimate Partner Violence:   . Fear of Current or Ex-Partner: Not on file  . Emotionally Abused: Not on file  .  Physically Abused: Not on file  . Sexually Abused: Not on file    Observations/Objective: Vitals:   11/11/20 0908  Weight: 150 lb 3 oz (68.1 kg)  Height: 5' (1.524 m)  Nontoxic appearance on video, euthymic mood, does not appear to responding to internal stimuli, affect was mood congruent.  Appropriate responses, all questions were answered.  No distress.  Assessment and Plan: Depression with anxiety  Attention deficit hyperactivity disorder (ADHD), combined type  Minimal improvement in anxiety symptoms with higher doses of BuSpar, but tolerated without any side effects, will try 1 further increase to a total dose of 20 mg/day with 10 mg twice daily.  Minimal change in attention symptoms.  Tolerating Adderall.  Will try higher dose of 30 mg with potential side effects discussed with recheck in 1 month.  RTC precautions.   Follow Up Instructions: 1 month, MyChart video visit.    I discussed the assessment and treatment plan with the patient. The patient was provided an opportunity to ask questions and all were answered. The patient agreed with the plan and demonstrated an understanding of the instructions.   The patient was advised to call back or seek an in-person evaluation if the symptoms worsen or if the condition fails to improve as anticipated.  I provided 21 minutes of non-face-to-face time during this encounter.   Shade Flood, MD

## 2020-11-11 NOTE — Patient Instructions (Signed)
° ° ° °  If you have lab work done today you will be contacted with your lab results within the next 2 weeks.  If you have not heard from us then please contact us. The fastest way to get your results is to register for My Chart. ° ° °IF you received an x-ray today, you will receive an invoice from [facility]. Please contact [facility] at [phone] with questions or concerns regarding your invoice.  ° °IF you received labwork today, you will receive an invoice from [facility]. Please contact [facility] at [phone] with questions or concerns regarding your invoice.  ° °Our billing staff will not be able to assist you with questions regarding bills from these companies. ° °You will be contacted with the lab results as soon as they are available. The fastest way to get your results is to activate your My Chart account. Instructions are located on the last page of this paperwork. If you have not heard from us regarding the results in 2 weeks, please contact this office. °  ° ° ° °

## 2020-12-07 ENCOUNTER — Encounter: Payer: Self-pay | Admitting: Family Medicine

## 2020-12-07 DIAGNOSIS — F902 Attention-deficit hyperactivity disorder, combined type: Secondary | ICD-10-CM

## 2020-12-13 ENCOUNTER — Other Ambulatory Visit: Payer: Self-pay | Admitting: Family Medicine

## 2020-12-13 DIAGNOSIS — F902 Attention-deficit hyperactivity disorder, combined type: Secondary | ICD-10-CM

## 2020-12-13 MED ORDER — AMPHETAMINE-DEXTROAMPHET ER 30 MG PO CP24: 30.0000 mg | ORAL_CAPSULE | ORAL | 0 refills | Status: DC

## 2020-12-13 NOTE — Progress Notes (Signed)
Controlled substance database (PDMP) reviewed. No concerns appreciated.  adderall last filled 11/11/20. Refill ordered.

## 2020-12-15 ENCOUNTER — Other Ambulatory Visit: Payer: Self-pay | Admitting: Family Medicine

## 2020-12-15 DIAGNOSIS — Z87898 Personal history of other specified conditions: Secondary | ICD-10-CM

## 2020-12-15 MED ORDER — AMPHETAMINE-DEXTROAMPHET ER 30 MG PO CP24: 30.0000 mg | ORAL_CAPSULE | Freq: Every day | ORAL | 0 refills | Status: DC

## 2021-01-11 ENCOUNTER — Other Ambulatory Visit: Payer: Self-pay | Admitting: Family Medicine

## 2021-01-11 ENCOUNTER — Encounter: Payer: Self-pay | Admitting: Family Medicine

## 2021-01-11 DIAGNOSIS — F418 Other specified anxiety disorders: Secondary | ICD-10-CM

## 2021-01-11 DIAGNOSIS — F902 Attention-deficit hyperactivity disorder, combined type: Secondary | ICD-10-CM

## 2021-01-11 NOTE — Telephone Encounter (Signed)
Per note from 11/11/2020 Pt needs recheck 12/2020. Pt needs appointment. 30 day refill .  Requested Prescriptions  Pending Prescriptions Disp Refills  . busPIRone (BUSPAR) 10 MG tablet [Pharmacy Med Name: BUSPIRONE HCL 10 MG TABLET] 60 tablet 0    Sig: TAKE 1 TABLET BY MOUTH TWICE A DAY     Psychiatry: Anxiolytics/Hypnotics - Non-controlled Passed - 01/11/2021  1:55 AM      Passed - Valid encounter within last 6 months    Recent Outpatient Visits          2 months ago Depression with anxiety   [facility], Asencion Partridge, MD   2 months ago Depression with anxiety   [facility], Asencion Partridge, MD   7 months ago Depression with anxiety   [facility], Asencion Partridge, MD   9 months ago Depression with anxiety   [facility], Asencion Partridge, MD   1 year ago Depression with anxiety   [facility], Asencion Partridge, MD

## 2021-01-17 MED ORDER — AMPHETAMINE-DEXTROAMPHET ER 30 MG PO CP24: 30.0000 mg | ORAL_CAPSULE | Freq: Every day | ORAL | 0 refills | Status: DC

## 2021-02-03 ENCOUNTER — Ambulatory Visit (INDEPENDENT_AMBULATORY_CARE_PROVIDER_SITE_OTHER): Payer: BC Managed Care – PPO | Admitting: Family Medicine

## 2021-02-03 ENCOUNTER — Encounter: Payer: Self-pay | Admitting: Family Medicine

## 2021-02-03 ENCOUNTER — Other Ambulatory Visit: Payer: Self-pay

## 2021-02-03 VITALS — BP 112/66 | HR 82 | Temp 98.6°F | Resp 16 | Ht 60.0 in | Wt 153.4 lb

## 2021-02-03 DIAGNOSIS — F902 Attention-deficit hyperactivity disorder, combined type: Secondary | ICD-10-CM

## 2021-02-03 DIAGNOSIS — F418 Other specified anxiety disorders: Secondary | ICD-10-CM

## 2021-02-03 MED ORDER — ADDERALL XR 30 MG PO CP24: 30.0000 mg | ORAL_CAPSULE | Freq: Every day | ORAL | 0 refills | Status: DC

## 2021-02-03 MED ORDER — BUSPIRONE HCL 15 MG PO TABS: 7.5000 mg | ORAL_TABLET | Freq: Three times a day (TID) | ORAL | 3 refills | Status: DC

## 2021-02-03 NOTE — Progress Notes (Signed)
Subjective:  Patient ID: Ashley Gardner, female    DOB: 1981/10/27  Age: 40 y.o. MRN: [id number]  CC:  Chief Complaint  Patient presents with  . ADHD    Pt here to go over her medications as she had trouble focusing and keeping attention through January   . Anxiety    Pt has had worse anxiety recently as well noting she had a panic attack yesterday     HPI Ashley Gardner presents for   Follow-up of depression with anxiety, ADHD. Last visit December 3. Has also met with counseling. Formal ADHD assessment October. Component of ADHD and mood on testing contributing to anxiety. BuSpar 5 mg twice daily, and tolerated 7.5 mg twice daily dosing, Wellbutrin 150 mg twice daily.  Minimal improvement on Adderall XR at that visit.  With persistent anxiety symptoms we increased BuSpar to 10 mg twice per day and with persistent ADD symptoms increased Adderall to 30 mg XR.   See patient message from earlier this month. More difficulty with focus over the past month. More emotional/depressed, overwhelmed and exhausted.   With initial increased dose of Adderrall XR 19m to 312m- felt better. Was on name brand initially, changed to generic past 2 months - trouble focusing, overeating came back. Feels decreased control on generic as above.   Mom had Bipolar d/o. Passed in 2016. Saw Dr. KaToy Care  Wellbutrin 15050mID.  Felt like increased dose of Buspar was working well, then January mychart message - increased tearfulness, weird feeling. Decreased buspar to mg BID.  Feeling more anxious. Panic attack yesterday lasting 73m39mHad been awhile since having panic attack. Only time of palpitations. Getting to sleep ok.  Did not tolerate prozac last year.  Has not seen therapist since November.   Depression screen PHQ [facility] 02/03/2021 11/11/2020 10/14/2020 05/19/2020 04/07/2020  Decreased Interest _0 Down, Depressed, Hopeless _1 PHQ - 2 Score _2 Altered sleeping 3 3 0 2 3  Tired,  decreased energy _3 Change in appetite 3 0 _4 Feeling bad or failure about yourself  _5 0 2  Trouble concentrating _6 Moving slowly or fidgety/restless 1 2 0 1 1  Suicidal thoughts 0 0 0 0 0  PHQ-9 Score _7 Difficult doing work/chores - - Extremely dIfficult - -   GAD 7 : Generalized Anxiety Score 02/03/2021 11/11/2020 10/14/2020 05/19/2020  Nervous, Anxious, on Edge _8 Control/stop worrying _9 Worry too much - different things _10 Trouble relaxing _11 Restless _12 Easily annoyed or irritable _13 Afraid - awful might happen 0 0 0 1  Total GAD 7 Score _14 Anxiety Difficulty - - Extremely difficult -             History Patient Active Problem List   Diagnosis Date Noted  . Family history of early CAD 09/30/2017  . Elevated blood pressure reading 08/06/2017  . Generalized anxiety disorder 08/06/2017  . Insomnia 08/06/2017  . Current severe episode of major depressive disorder without psychotic features without prior episode (HCC)[place]/28/2018   Past Medical History:  Diagnosis Date  . Anxiety    Decreased concentration, Excessive worry, insomnia, irritability,  nervous/anxious behavior,obessions(food) restlessness.  . Depression    Feelings of hopelessness, feeling of worthlessness  . Hyperlipidemia   . Hypersomnia   . Palpitations   . Restlessness    Past Surgical History:  Procedure Laterality Date  . LEFT MASS BIOPSY     Benign appearing fragments ofbone and fibrous tissue, deferential    Allergies  Allergen Reactions  . Penicillins Hives   Prior to Admission medications   Medication Sig Start Date End Date Taking? Authorizing Provider  amphetamine-dextroamphetamine (ADDERALL XR) 30 MG 24 hr capsule Take 1 capsule (30 mg total) by mouth daily. 01/17/21  Yes [name], MD  buPROPion (WELLBUTRIN SR) 150 MG 12 hr tablet TAKE 1 TABLET (150 MG TOTAL) BY MOUTH 2 (TWO) TIMES DAILY.  START WITH 1 TAB ONCE A DAY FOR 1ST WEEK. 06/29/20  Yes [name], MD  busPIRone (BUSPAR) 10 MG tablet TAKE 1 TABLET BY MOUTH TWICE A DAY 01/11/21  Yes [name], MD  LO LOESTRIN FE 1 MG-10 MCG / 10 MCG tablet Take 1 tablet by mouth daily. 05/11/20  Yes [provider]  metoprolol succinate (TOPROL-XL) 25 MG 24 hr tablet TAKE 1 TABLET BY MOUTH EVERY DAY 12/15/20  Yes [name], MD   Social History   Socioeconomic History  . Marital status: Married    Spouse name: Not on file  . Number of children: Not on file  . Years of education: Not on file  . Highest education level: Not on file  Occupational History  . Not on file  Tobacco Use  . Smoking status: Never Smoker  . Smokeless tobacco: Never Used  Substance and Sexual Activity  . Alcohol use: Yes  . Drug use: No  . Sexual activity: Not on file  Other Topics Concern  . Not on file  Social History Narrative  . Not on file   Social Determinants of Health   Financial Resource Strain: Not on file  Food Insecurity: Not on file  Transportation Needs: Not on file  Physical Activity: Not on file  Stress: Not on file  Social Connections: Not on file  Intimate Partner Violence: Not on file    Review of Systems   Objective:   Vitals:   02/03/21 1540 02/03/21 1607  BP: (!) 145/85 112/66  Pulse: 82   Resp: 16   Temp: 98.6 F (37 C)   TempSrc: Temporal   SpO2: 99%   Weight: 153 lb 6.4 oz (69.6 kg)   Height: 5' (1.524 m)      Physical Exam Constitutional:      General: She is not in acute distress.    Appearance: She is well-developed and well-nourished.  HENT:     Head: Normocephalic and atraumatic.  Cardiovascular:     Rate and Rhythm: Normal rate.  Pulmonary:     Effort: Pulmonary effort is normal.  Neurological:     Mental Status: She is alert and oriented to person, place, and time.  Psychiatric:        Attention and Perception: Attention and perception normal.        Mood and  Affect: Mood is anxious.        Speech: Speech normal.        Behavior: Behavior normal.     Comments: Tearful during exam at times.        38 minutes spent during visit, greater than 50% counseling and assimilation of information, chart review, and discussion of plan.  Assessment & Plan:  Ashley Gardner is a 39 y.o. female . Attention deficit hyperactivity disorder (ADHD), combined type - Plan: ADDERALL XR 30 MG 24 hr capsule  Depression with anxiety - Plan: busPIRone (BUSPAR) 15 MG tablet  Initially had some improvement with higher dose of Adderall XR as well as higher dose buspirone.  Appears to have had worsening symptoms since change to generic Adderall.  Worsening anxiety with lower dose of buspirone but initially changed due to concerns of buspirone and Adderall combo.  Family history of bipolar but denies manic symptoms.  Still suspect anxiety with ADD but would consider meeting with psychiatry if changes from today are not significantly improving symptoms.  Understanding expressed.  -Restart Adderall XR, name brand only for now.  -Increase buspirone slightly to 7.5 mg twice daily  -Continue Wellbutrin same dose  -Update on symptoms next few weeks by MyChart to determine follow-up plan.  Meds ordered this encounter  Medications  . ADDERALL XR 30 MG 24 hr capsule    Sig: Take 1 capsule (30 mg total) by mouth daily.    Dispense:  30 capsule    Refill:  0    Ok to fill now- name brand. Did not tolerate generic.  . busPIRone (BUSPAR) 15 MG tablet    Sig: Take 0.5 tablets (7.5 mg total) by mouth 3 (three) times daily.    Dispense:  30 tablet    Refill:  3   Patient Instructions    Give me an update by Mychart in next 2-3 weeks. Will try name brand Adderall for now, same dose wellbutrin, and increase Buspar to 7.5mg twice per day. Contact therapist for follow up appointment. As we discussed I would consider meeting with psychiatry - Dr. Kaur if current changes are not  effective.  Keep me posted.   If you have lab work done today you will be contacted with your lab results within the next 2 weeks.  If you have not heard from us then please contact us. The fastest way to get your results is to register for My Chart.   IF you received an x-ray today, you will receive an invoice from [facility]. Please contact  Radiology at [phone] with questions or concerns regarding your invoice.   IF you received labwork today, you will receive an invoice from [facility]. Please contact [facility] at [phone] with questions or concerns regarding your invoice.   Our billing staff will not be able to assist you with questions regarding bills from these companies.  You will be contacted with the lab results as soon as they are available. The fastest way to get your results is to activate your My Chart account. Instructions are located on the last page of this paperwork. If you have not heard from us regarding the results in 2 weeks, please contact this office.         Signed, Jeffrey Greene, MD [facility]  

## 2021-02-03 NOTE — Patient Instructions (Addendum)
  Give me an update by Mychart in next 2-3 weeks. Will try name brand Adderall for now, same dose wellbutrin, and increase Buspar to 7.5mg  twice per day. Contact therapist for follow up appointment. As we discussed I would consider meeting with psychiatry - Dr. Evelene Croon if current changes are not effective.  Keep me posted.   If you have lab work done today you will be contacted with your lab results within the next 2 weeks.  If you have not heard from [place] then please contact us. The fastest way to get your results is to register for My Chart.   IF you received an x-ray today, you will receive an invoice from [facility]. Please contact [facility] at [phone] with questions or concerns regarding your invoice.   IF you received labwork today, you will receive an invoice from [facility]. Please contact [facility] at [phone] with questions or concerns regarding your invoice.   Our billing staff will not be able to assist you with questions regarding bills from these companies.  You will be contacted with the lab results as soon as they are available. The fastest way to get your results is to activate your My Chart account. Instructions are located on the last page of this paperwork. If you have not heard from [place] regarding the results in 2 weeks, please contact this office.

## 2021-02-09 ENCOUNTER — Other Ambulatory Visit: Payer: Self-pay | Admitting: Family Medicine

## 2021-02-09 DIAGNOSIS — F418 Other specified anxiety disorders: Secondary | ICD-10-CM

## 2021-02-16 ENCOUNTER — Encounter: Payer: Self-pay | Admitting: Family Medicine

## 2021-02-17 NOTE — Telephone Encounter (Signed)
Pt reports needed PA for adderall. Unsure if you had started this already

## 2021-02-21 NOTE — Telephone Encounter (Signed)
PA KEY: [id number] ADDERALL

## 2021-02-27 NOTE — Telephone Encounter (Signed)
Received call from Linnea at [facility]. Prior auth needed for Adderall refill and not received.   Medication:   ADDERALL XR 30 MG 24 hr capsule [[id number]]  Pharmacy:   [facility] [address] [facility] [employer] [place], [place] - [address]  [address] [address]  Phone:  [phone] Fax:  [phone]   Christain [place] stated the prior auth has to say "brand medically necessary" to be accepted. Please advise Linnea at [phone]

## 2021-02-27 NOTE — Telephone Encounter (Signed)
aparently pt needs PA that says Brand Medically Necessary. Please help

## 2021-02-28 ENCOUNTER — Telehealth: Payer: Self-pay | Admitting: *Deleted

## 2021-02-28 DIAGNOSIS — F902 Attention-deficit hyperactivity disorder, combined type: Secondary | ICD-10-CM

## 2021-02-28 NOTE — Telephone Encounter (Signed)
Message sent to patient. Could consider Ritalin. Will wait on her response.

## 2021-02-28 NOTE — Telephone Encounter (Signed)
Dr. Neva Seat,  I called to do a peer to peer for the Adderall (name brand).  They pharmacist Morrie Sheldon) states they cannot approve brand name.   They will only review if the patient has tried and failed other medication,  This doesn't include the generic of adderall.   I didn't see any other medications beside the amphetamine.

## 2021-02-28 NOTE — Telephone Encounter (Signed)
Message sent to Dr. Neva Seat.   Peer to Peer done  Denied the Brand without failure of other medication, even the generic.

## 2021-03-02 NOTE — Telephone Encounter (Signed)
Insurance needs rational for brand name julie notes unable to find reason in chart or any failed previous medications. Please advise so we can try again on PA?

## 2021-03-02 NOTE — Telephone Encounter (Signed)
Pt ok trying ritalin

## 2021-03-03 MED ORDER — METHYLPHENIDATE HCL 5 MG PO TABS: 5.0000 mg | ORAL_TABLET | Freq: Two times a day (BID) | ORAL | 0 refills | Status: DC

## 2021-03-03 NOTE — Addendum Note (Signed)
Addended by: Meredith Staggers R on: 03/03/2021 01:31 PM   Modules accepted: Orders

## 2021-03-09 ENCOUNTER — Other Ambulatory Visit: Payer: Self-pay | Admitting: Family Medicine

## 2021-03-09 DIAGNOSIS — Z87898 Personal history of other specified conditions: Secondary | ICD-10-CM

## 2021-03-09 NOTE — Telephone Encounter (Signed)
Requested medications are due for refill today.  yes  Requested medications are on the active medications list.  yes  Last refill. 12/15/2020  Future visit scheduled.   yes  Notes to clinic.

## 2021-03-13 ENCOUNTER — Encounter: Payer: Self-pay | Admitting: Family Medicine

## 2021-03-13 NOTE — Telephone Encounter (Signed)
See patient FYI message below about medication please.

## 2021-03-14 ENCOUNTER — Other Ambulatory Visit: Payer: Self-pay | Admitting: Family Medicine

## 2021-03-14 DIAGNOSIS — F902 Attention-deficit hyperactivity disorder, combined type: Secondary | ICD-10-CM

## 2021-03-14 MED ORDER — METHYLPHENIDATE HCL 10 MG PO TABS: 10.0000 mg | ORAL_TABLET | Freq: Two times a day (BID) | ORAL | 0 refills | Status: DC

## 2021-03-14 NOTE — Progress Notes (Unsigned)
See message - dose change.

## 2021-03-15 ENCOUNTER — Other Ambulatory Visit: Payer: Self-pay

## 2021-03-15 DIAGNOSIS — Z87898 Personal history of other specified conditions: Secondary | ICD-10-CM

## 2021-03-15 MED ORDER — METOPROLOL SUCCINATE ER 25 MG PO TB24: 1.0000 | ORAL_TABLET | Freq: Every day | ORAL | 0 refills | Status: DC

## 2021-03-15 NOTE — Telephone Encounter (Signed)
Medication was sent to the pharmacy  °

## 2021-03-23 NOTE — Telephone Encounter (Signed)
Attempted to call patient about medication. Left voicemail to return call.

## 2021-03-27 ENCOUNTER — Other Ambulatory Visit: Payer: Self-pay | Admitting: Family Medicine

## 2021-03-27 DIAGNOSIS — F418 Other specified anxiety disorders: Secondary | ICD-10-CM

## 2021-05-03 ENCOUNTER — Telehealth (INDEPENDENT_AMBULATORY_CARE_PROVIDER_SITE_OTHER): Payer: BC Managed Care – PPO | Admitting: Family Medicine

## 2021-05-03 DIAGNOSIS — F418 Other specified anxiety disorders: Secondary | ICD-10-CM

## 2021-05-03 DIAGNOSIS — Z87898 Personal history of other specified conditions: Secondary | ICD-10-CM

## 2021-05-03 DIAGNOSIS — F988 Other specified behavioral and emotional disorders with onset usually occurring in childhood and adolescence: Secondary | ICD-10-CM

## 2021-05-03 DIAGNOSIS — F902 Attention-deficit hyperactivity disorder, combined type: Secondary | ICD-10-CM | POA: Diagnosis not present

## 2021-05-03 MED ORDER — METHYLPHENIDATE HCL 10 MG PO TABS: 10.0000 mg | ORAL_TABLET | Freq: Two times a day (BID) | ORAL | 0 refills | Status: DC

## 2021-05-03 MED ORDER — BUPROPION HCL ER (SR) 150 MG PO TB12: ORAL_TABLET | ORAL | 1 refills | Status: DC

## 2021-05-03 MED ORDER — METOPROLOL SUCCINATE ER 25 MG PO TB24
1.0000 | ORAL_TABLET | Freq: Every day | ORAL | 0 refills | Status: DC
Start: 2021-05-03 — End: 2021-08-25

## 2021-05-03 NOTE — Progress Notes (Signed)
Virtual Visit via Video Note  I connected with Ashley Gardner on 05/03/21 at 11:30 AM by a video enabled telemedicine application and verified that I am speaking with the correct person using two identifiers.  Patient location: outside of work.  My location: office- [employer].    Due to timing at work, and short window for her initial video visit, call requested between 1130 and 1220.  I discussed the limitations, risks, security and privacy concerns of performing an evaluation and management service by telephone and the availability of in person appointments. I also discussed with the patient that there may be a patient responsible charge related to this service. The patient expressed understanding and agreed to proceed, consent obtained  Chief complaint:  Chief Complaint  Patient presents with  . Medication Refill    Pt got notice from pharmacy needed an appt to get more refills of her ritalin but notes needs refills of others as well.   . ADHD    Pt doing well notes the ritalin is doing well with the dose /medication change.   . Depression    Pt in need of refill wellbutrin and notes she is doing well no break through sxs. No concerns at this time      History of Present Illness: Ashley Gardner is a 40 y.o. female  ADHD with depression/anxiety. See prior notes.  Formal ADHD assessment in October 2021.  Component of ADHD and mood disorder on testing and contributed to anxiety.  Treated with Wellbutrin 150 mg twice daily, BuSpar with various dosing changes.  Last visit February 25.  Thought to have worsening symptoms since change to generic Adderall at that time.  Worsening anxiety with lower dose of buspirone but initially made that change due to concerns of buspirone and Adderall combo.  Option of psychiatry follow-up if not improving with changes from last visit.  We restarted her initially on Adderall XR brand-name only, buspirone increased to 7.5 mg twice daily and  continued Wellbutrin same dose.  Due to coverage, did have to change Adderall to Ritalin.  Initially 5 mg twice daily then 10 mg twice daily.  See notes, was doing well on Ritalin.  Doing well - feels good on ritalin 10mg  BID.  Focusing well, no appetite suppression, no insomnia. Not stress or overeating as in past.   Controlled substance database (PDMP) reviewed. No concerns appreciated. Last filled ritalin 10mg  on 03/29/21.   History of palpitations Treated with Toprol 25 mg daily.  Doing well on this dose - no recent CP/palpitations.   Patient Active Problem List   Diagnosis Date Noted  . Family history of early CAD 09/30/2017  . Elevated blood pressure reading 08/06/2017  . Generalized anxiety disorder 08/06/2017  . Insomnia 08/06/2017  . Current severe episode of major depressive disorder without psychotic features without prior episode (HCC) 08/06/2017   Past Medical History:  Diagnosis Date  . Anxiety    Decreased concentration, Excessive worry, insomnia, irritability, nervous/anxious behavior,obessions(food) restlessness.  . Depression    Feelings of hopelessness, feeling of worthlessness  . Hyperlipidemia   . Hypersomnia   . Palpitations   . Restlessness    Past Surgical History:  Procedure Laterality Date  . LEFT MASS BIOPSY     Benign appearing fragments ofbone and fibrous tissue, deferential    Allergies  Allergen Reactions  . Penicillins Hives   Prior to Admission medications   Medication Sig Start Date End Date Taking? Authorizing Provider  buPROPion [facility] SR)  150 MG 12 hr tablet TAKE 1 TABLET (150 MG TOTAL) BY MOUTH 2 (TWO) TIMES DAILY. START WITH 1 TAB ONCE A DAY FOR 1ST WEEK. 06/29/20  Yes Shade Flood, MD  busPIRone (BUSPAR) 15 MG tablet Take 0.5 tablets (7.5 mg total) by mouth 3 (three) times daily. 02/03/21  Yes Shade Flood, MD  LO LOESTRIN FE 1 MG-10 MCG / 10 MCG tablet Take 1 tablet by mouth daily. 05/11/20  Yes [provider]   methylphenidate (RITALIN) 10 MG tablet Take 1 tablet (10 mg total) by mouth 2 (two) times daily with breakfast and lunch. 03/14/21  Yes Shade Flood, MD  metoprolol succinate (TOPROL-XL) 25 MG 24 hr tablet Take 1 tablet (25 mg total) by mouth daily. 03/15/21  Yes Shade Flood, MD   Social History   Socioeconomic History  . Marital status: Married    Spouse name: Not on file  . Number of children: Not on file  . Years of education: Not on file  . Highest education level: Not on file  Occupational History  . Not on file  Tobacco Use  . Smoking status: Never Smoker  . Smokeless tobacco: Never Used  Substance and Sexual Activity  . Alcohol use: Yes  . Drug use: No  . Sexual activity: Not on file  Other Topics Concern  . Not on file  Social History Narrative  . Not on file   Social Determinants of Health   Financial Resource Strain: Not on file  Food Insecurity: Not on file  Transportation Needs: Not on file  Physical Activity: Not on file  Stress: Not on file  Social Connections: Not on file  Intimate Partner Violence: Not on file    Observations/Objective: Vitals:   05/03/21 1036  Weight: 150 lb (68 kg)  euthymic mood.  No distress on video, appropriate responses.  No respiratory distress.  Nontoxic appearance.  Understanding expressed of plan and all questions were answered.  Assessment and Plan: Depression with anxiety - Plan: buPROPion (WELLBUTRIN SR) 150 MG 12 hr tablet  Attention deficit hyperactivity disorder (ADHD), combined type - Plan: methylphenidate (RITALIN) 10 MG tablet, methylphenidate (RITALIN) 10 MG tablet, methylphenidate (RITALIN) 10 MG tablet  History of palpitations - Plan: metoprolol succinate (TOPROL-XL) 25 MG 24 hr tablet  Symptoms well controlled, mood well controlled with current regimen including changed to Ritalin.  We will continue same dose of Wellbutrin, Ritalin 10 mg twice daily as no new side effects.  3 months of prescription  given with 52-month in office follow-up for exam and likely urine testing at that time.  Palpitations also controlled with Toprol, refill meds.  Follow Up Instructions: 3 months in office   I discussed the assessment and treatment plan with the patient. The patient was provided an opportunity to ask questions and all were answered. The patient agreed with the plan and demonstrated an understanding of the instructions.   The patient was advised to call back or seek an in-person evaluation if the symptoms worsen or if the condition fails to improve as anticipated.  I provided 20 minutes of non-face-to-face time during this encounter.   Shade Flood, MD

## 2021-06-16 ENCOUNTER — Other Ambulatory Visit: Payer: Self-pay | Admitting: Family Medicine

## 2021-06-16 DIAGNOSIS — F902 Attention-deficit hyperactivity disorder, combined type: Secondary | ICD-10-CM

## 2021-06-16 NOTE — Telephone Encounter (Signed)
Unable to refuse. 3 prescriptions were sent on 05/03/21.

## 2021-06-19 DIAGNOSIS — E8881 Metabolic syndrome: Secondary | ICD-10-CM | POA: Diagnosis not present

## 2021-06-19 DIAGNOSIS — F5081 Binge eating disorder: Secondary | ICD-10-CM | POA: Diagnosis not present

## 2021-06-19 DIAGNOSIS — Z131 Encounter for screening for diabetes mellitus: Secondary | ICD-10-CM | POA: Diagnosis not present

## 2021-06-19 DIAGNOSIS — R5381 Other malaise: Secondary | ICD-10-CM | POA: Diagnosis not present

## 2021-06-19 DIAGNOSIS — E559 Vitamin D deficiency, unspecified: Secondary | ICD-10-CM | POA: Diagnosis not present

## 2021-06-19 DIAGNOSIS — E7800 Pure hypercholesterolemia, unspecified: Secondary | ICD-10-CM | POA: Diagnosis not present

## 2021-06-19 DIAGNOSIS — R635 Abnormal weight gain: Secondary | ICD-10-CM | POA: Diagnosis not present

## 2021-06-19 DIAGNOSIS — R0602 Shortness of breath: Secondary | ICD-10-CM | POA: Diagnosis not present

## 2021-06-20 DIAGNOSIS — Z113 Encounter for screening for infections with a predominantly sexual mode of transmission: Secondary | ICD-10-CM | POA: Diagnosis not present

## 2021-06-20 DIAGNOSIS — Z01419 Encounter for gynecological examination (general) (routine) without abnormal findings: Secondary | ICD-10-CM | POA: Diagnosis not present

## 2021-06-20 DIAGNOSIS — Z6830 Body mass index (BMI) 30.0-30.9, adult: Secondary | ICD-10-CM | POA: Diagnosis not present

## 2021-06-20 LAB — HM PAP SMEAR: HM Pap smear: NORMAL

## 2021-06-20 LAB — RESULTS CONSOLE HPV: CHL HPV: NEGATIVE

## 2021-06-26 DIAGNOSIS — Z1231 Encounter for screening mammogram for malignant neoplasm of breast: Secondary | ICD-10-CM | POA: Diagnosis not present

## 2021-06-29 ENCOUNTER — Other Ambulatory Visit: Payer: Self-pay | Admitting: Obstetrics and Gynecology

## 2021-06-29 DIAGNOSIS — R928 Other abnormal and inconclusive findings on diagnostic imaging of breast: Secondary | ICD-10-CM

## 2021-07-05 ENCOUNTER — Encounter: Payer: Self-pay | Admitting: Family Medicine

## 2021-07-11 ENCOUNTER — Encounter: Payer: Self-pay | Admitting: Family Medicine

## 2021-07-14 ENCOUNTER — Other Ambulatory Visit: Payer: BC Managed Care – PPO

## 2021-07-20 ENCOUNTER — Other Ambulatory Visit: Payer: Self-pay

## 2021-07-20 ENCOUNTER — Ambulatory Visit: Payer: BC Managed Care – PPO

## 2021-07-20 ENCOUNTER — Ambulatory Visit
Admission: RE | Admit: 2021-07-20 | Discharge: 2021-07-20 | Disposition: A | Discharge status: Home / Self Care | Payer: BC Managed Care – PPO | Source: Ambulatory Visit | Attending: Obstetrics and Gynecology | Admitting: Obstetrics and Gynecology

## 2021-07-20 DIAGNOSIS — R928 Other abnormal and inconclusive findings on diagnostic imaging of breast: Secondary | ICD-10-CM

## 2021-07-20 DIAGNOSIS — R922 Inconclusive mammogram: Secondary | ICD-10-CM | POA: Diagnosis not present

## 2021-08-25 ENCOUNTER — Encounter: Payer: Self-pay | Admitting: Family Medicine

## 2021-08-25 ENCOUNTER — Ambulatory Visit (INDEPENDENT_AMBULATORY_CARE_PROVIDER_SITE_OTHER): Payer: Commercial Managed Care - PPO | Admitting: Family Medicine

## 2021-08-25 ENCOUNTER — Other Ambulatory Visit: Payer: Self-pay

## 2021-08-25 VITALS — BP 132/74 | HR 67 | Temp 98.3°F | Resp 16 | Ht 60.0 in | Wt 151.4 lb

## 2021-08-25 DIAGNOSIS — Z23 Encounter for immunization: Secondary | ICD-10-CM | POA: Diagnosis not present

## 2021-08-25 DIAGNOSIS — Z87898 Personal history of other specified conditions: Secondary | ICD-10-CM | POA: Diagnosis not present

## 2021-08-25 DIAGNOSIS — F902 Attention-deficit hyperactivity disorder, combined type: Secondary | ICD-10-CM | POA: Diagnosis not present

## 2021-08-25 DIAGNOSIS — F418 Other specified anxiety disorders: Secondary | ICD-10-CM | POA: Diagnosis not present

## 2021-08-25 MED ORDER — METOPROLOL SUCCINATE ER 25 MG PO TB24: 25.0000 mg | ORAL_TABLET | Freq: Every day | ORAL | 0 refills | Status: DC

## 2021-08-25 MED ORDER — BUPROPION HCL ER (SR) 150 MG PO TB12: ORAL_TABLET | ORAL | 1 refills | Status: DC

## 2021-08-25 MED ORDER — BUSPIRONE HCL 15 MG PO TABS: 7.5000 mg | ORAL_TABLET | Freq: Three times a day (TID) | ORAL | 1 refills | Status: DC

## 2021-08-25 NOTE — Progress Notes (Signed)
Subjective:  Patient ID: Ashley Gardner, female    DOB: [birth year]  Age: 40 y.o. MRN: [id number]  CC:  Chief Complaint  Patient presents with   Hypertension    Pt here for refill on metoprolol   Depression    Pt here for refill on wellbutrin, as well as buspar    Immunizations    Pt due for tetanus shot     HPI Ashley Gardner presents for   History of palpitations Treated with metoprolol 25 mg qd. has been well controlled, no recent palpitations. BP 118/79 yesterday. BP Readings from Last 3 Encounters:  08/25/21 132/74  02/03/21 112/66  10/14/20 135/87    Anxiety with depression See prior notes, history of ADD, formal ADHD assessment October 2021 with component of ADHD and mood disorder contributing to anxiety.  Treated with Wellbutrin 150 mg twice daily, BuSpar with various dosing changes.  Off stimulants for now given the current treatment for weight loss and use of phentermine.  Option of Strattera but holding on medications for now.  Weight down to 146 yesterday at [facility] clinic. Max weight in July 154.  On phentermine 37mg .   Some difficulty with focus, but unable to take Strattera or other meds for ADD at this time.  Anxiety/depression stable. Some flairs at times but recovers quickly.  No current therapist. Cost prohibitive. Would considering following up with Ashley Gardner.  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 Unknown if EAP there.  Would like to remain same dose meds.   Depression screen [facility] 2/9 08/25/2021 05/03/2021 02/03/2021 11/11/2020 10/14/2020  Decreased Interest 1 0 1 3 1   Down, Depressed, Hopeless 1 1 1 1 1   PHQ - 2 Score 2 1 2 4 2   Altered sleeping 3 1 3 3  0  Tired, decreased energy 3 1 3 3 3   Change in appetite 1 2 3  0 3  Feeling bad or failure about yourself  2 0 1 3 1   Trouble concentrating 3 1 3 3 3   Moving slowly or fidgety/restless 0 0 1 2 0  Suicidal thoughts 0 0 0 0 0  PHQ-9 Score  14 6 16 18 12   Difficult doing work/chores - - - - Extremely dIfficult   GAD 7 : Generalized Anxiety Score 05/03/2021 02/03/2021 11/11/2020 10/14/2020  Nervous, Anxious, on Edge 1 2 2 2   Control/stop worrying 1 1 2 1   Worry too much - different things 1 1 2 1   Trouble relaxing 1 2 3 1   Restless 2 3 2 3   Easily annoyed or irritable 1 3 2 2   Afraid - awful might happen 0 0 0 0  Total GAD 7 Score 7 12 13 10   Anxiety Difficulty - - - Extremely difficult    Health maintenance Immunization History  Administered Date(s) Administered   Influenza-Unspecified 08/31/2020   MMR 07/08/2018   PFIZER Comirnaty(Gray Top)Covid-19 Tri-Sucrose Vaccine 05/19/2020, 11/09/2020   Tdap 08/25/2021  Tdap today.   History Patient Active Problem List   Diagnosis Date Noted   ADD (attention deficit disorder) 09/2020   Family history of early CAD 09/30/2017   Elevated blood pressure reading 08/06/2017   Generalized anxiety disorder 08/06/2017   Insomnia 08/06/2017   Current severe episode of major depressive disorder without psychotic features without prior episode ([place]) 08/06/2017   Past Medical History:  Diagnosis Date   Anxiety    Decreased concentration, Excessive worry, insomnia, irritability, nervous/anxious behavior,obessions(food) restlessness.  Depression    Feelings of hopelessness, feeling of worthlessness   Hyperlipidemia    Hypersomnia    Palpitations    Restlessness    Past Surgical History:  Procedure Laterality Date   LEFT MASS BIOPSY     Benign appearing fragments of  bone and fibrous tissue, deferential     Allergies  Allergen Reactions   Penicillins Hives   Prior to Admission medications   Medication Sig Start Date End Date Taking? Authorizing Provider  buPROPion (WELLBUTRIN SR) 150 MG 12 hr tablet TAKE 1 TABLET (150 MG TOTAL) BY MOUTH 2 (TWO) TIMES DAILY. 05/03/21  Yes [name], MD  busPIRone (BUSPAR) 15 MG tablet Take 0.5 tablets (7.5 mg total) by mouth 3 (three)  times daily. 02/03/21  Yes [name], MD  LO LOESTRIN FE 1 MG-10 MCG / 10 MCG tablet Take 1 tablet by mouth daily. 05/11/20  Yes [provider]  metoprolol succinate (TOPROL-XL) 25 MG 24 hr tablet Take 1 tablet (25 mg total) by mouth daily. 05/03/21  Yes [name], MD  phentermine 37.5 MG capsule Take 37.5 mg by mouth every morning.   Yes [provider]  methylphenidate (RITALIN) 10 MG tablet Take 1 tablet (10 mg total) by mouth 2 (two) times daily with breakfast and lunch. Patient not taking: Reported on 08/25/2021 05/03/21   [name], MD  methylphenidate (RITALIN) 10 MG tablet Take 1 tablet (10 mg total) by mouth 2 (two) times daily. Patient not taking: Reported on 08/25/2021 05/03/21   [name], MD  methylphenidate (RITALIN) 10 MG tablet Take 1 tablet (10 mg total) by mouth 2 (two) times daily. Patient not taking: Reported on 08/25/2021 05/03/21   [name], MD   Social History   Socioeconomic History   Marital status: Divorced    Spouse name: Not on file   Number of children: Not on file   Years of education: Not on file   Highest education level: Not on file  Occupational History   Not on file  Tobacco Use   Smoking status: Never   Smokeless tobacco: Never  Substance and Sexual Activity   Alcohol use: Yes   Drug use: No   Sexual activity: Not on file  Other Topics Concern   Not on file  Social History Narrative   Not on file   Social Determinants of Health   Financial Resource Strain: Not on file  Food Insecurity: Not on file  Transportation Needs: Not on file  Physical Activity: Not on file  Stress: Not on file  Social Connections: Not on file  Intimate Partner Violence: Not on file    Review of Systemsper HPI.   Objective:   Vitals:   08/25/21 1022  BP: 132/74  Pulse: 67  Resp: 16  Temp: 98.3 F (36.8 C)  TempSrc: Temporal  SpO2: 100%  Weight: 151 lb 6.4 oz (68.7 kg)  Height: 5' (1.524 m)     Physical Exam Vitals reviewed.  Constitutional:      Appearance: Normal appearance. She is well-developed.  HENT:     Head: Normocephalic and atraumatic.  Eyes:     Conjunctiva/sclera: Conjunctivae normal.     Pupils: Pupils are equal, round, and reactive to light.  Neck:     Vascular: No carotid bruit.  Cardiovascular:     Rate and Rhythm: Normal rate and regular rhythm.     Heart sounds: Normal heart sounds.  Pulmonary:     Effort:  Pulmonary effort is normal.     Breath sounds: Normal breath sounds.  Abdominal:     Palpations: Abdomen is soft. There is no pulsatile mass.     Tenderness: There is no abdominal tenderness.  Musculoskeletal:     Right lower leg: No edema.     Left lower leg: No edema.  Skin:    General: Skin is warm and dry.  Neurological:     Mental Status: She is alert and oriented to person, place, and time.  Psychiatric:        Mood and Affect: Mood normal.        Behavior: Behavior normal.       Assessment & Plan:  Ashley Gardner is a 40 y.o. female . Depression with anxiety - Plan: busPIRone (BUSPAR) 15 MG tablet, buPROPion (WELLBUTRIN SR) 150 MG 12 hr tablet Attention deficit hyperactivity disorder (ADHD), combined type  - reports overall stable depression/anxiety sx's.  Continue same dose Wellbutrin, BuSpar.  35-month follow-up.  Recommended counseling, potentially through EAP at work or meeting with her previous therapist.   -Unfortunately unable to start new ADD medication at this time with her current weight loss treatment.  Option of Strattera or higher dose Wellbutrin discussed.  No changes for now.  History of palpitations - Plan: metoprolol succinate (TOPROL-XL) 25 MG 24 hr tablet  -  Stable, tolerating current regimen. Medications refilled. Labs at other facility she may bring a copy for me to review.  No labs for today.     Need for tetanus booster - Plan: Tdap vaccine greater than or equal to 7yo IM   Meds ordered this encounter   Medications   busPIRone (BUSPAR) 15 MG tablet    Sig: Take 0.5 tablets (7.5 mg total) by mouth 3 (three) times daily.    Dispense:  135 tablet    Refill:  1   buPROPion (WELLBUTRIN SR) 150 MG 12 hr tablet    Sig: TAKE 1 TABLET (150 MG TOTAL) BY MOUTH 2 (TWO) TIMES DAILY.    Dispense:  180 tablet    Refill:  1   metoprolol succinate (TOPROL-XL) 25 MG 24 hr tablet    Sig: Take 1 tablet (25 mg total) by mouth daily.    Dispense:  90 tablet    Refill:  0   Patient Instructions   If there is an option for other medication such as Strattera with ADD, let me know and we can discuss those meds.  We also have option of increasing Wellbutrin if needed but okay to remain on same dose for now.  Counseling may also be helpful, can check with your full-time employer to see if an EAP program is available to follow-up with previous therapist or new therapist if needed.  Let me know if there are questions.  I am happy to review outside labs and decide if any follow-up is needed.  Thanks for coming in today and take care.      Signed,   Merri Ray, MD [facility] 08/25/21 1:03 PM

## 2021-08-25 NOTE — Patient Instructions (Signed)
If there is an option for other medication such as Strattera with ADD, let me know and we can discuss those meds.  We also have option of increasing Wellbutrin if needed but okay to remain on same dose for now.  Counseling may also be helpful, can check with your full-time employer to see if an EAP program is available to follow-up with previous therapist or new therapist if needed.  Let me know if there are questions.  I am happy to review outside labs and decide if any follow-up is needed.  Thanks for coming in today and take care.

## 2021-11-01 ENCOUNTER — Other Ambulatory Visit: Payer: Self-pay | Admitting: Family Medicine

## 2021-11-01 DIAGNOSIS — F418 Other specified anxiety disorders: Secondary | ICD-10-CM

## 2021-11-01 NOTE — Telephone Encounter (Signed)
Patient is requesting a refill of the following medications: Requested Prescriptions   Pending Prescriptions Disp Refills   buPROPion (WELLBUTRIN SR) 150 MG 12 hr tablet [Pharmacy Med Name: BUPROPION HCL SR 150 MG TABLET] 180 tablet 2    Sig: TAKE 1 TABLET BY MOUTH TWICE A DAY    Date of patient request: 11/01/2021 Last office visit: 08/25/2021 Date of last refill: 08/25/2021 Last refill amount: 180 tablets  Follow up time period per chart: 02/23/2022

## 2022-02-23 ENCOUNTER — Encounter: Payer: Self-pay | Admitting: Family Medicine

## 2022-02-23 ENCOUNTER — Ambulatory Visit (INDEPENDENT_AMBULATORY_CARE_PROVIDER_SITE_OTHER): Payer: Commercial Managed Care - PPO | Admitting: Family Medicine

## 2022-02-23 DIAGNOSIS — Z87898 Personal history of other specified conditions: Secondary | ICD-10-CM

## 2022-02-23 DIAGNOSIS — F418 Other specified anxiety disorders: Secondary | ICD-10-CM

## 2022-02-23 DIAGNOSIS — F902 Attention-deficit hyperactivity disorder, combined type: Secondary | ICD-10-CM | POA: Diagnosis not present

## 2022-02-23 MED ORDER — METHYLPHENIDATE HCL 20 MG PO TABS: 20.0000 mg | ORAL_TABLET | Freq: Two times a day (BID) | ORAL | 0 refills | Status: DC

## 2022-02-23 MED ORDER — BUPROPION HCL ER (SR) 200 MG PO TB12: 200.0000 mg | ORAL_TABLET | Freq: Two times a day (BID) | ORAL | 1 refills | Status: DC

## 2022-02-23 MED ORDER — BUSPIRONE HCL 15 MG PO TABS: 7.5000 mg | ORAL_TABLET | Freq: Three times a day (TID) | ORAL | 1 refills | Status: DC

## 2022-02-23 MED ORDER — METOPROLOL SUCCINATE ER 25 MG PO TB24: 25.0000 mg | ORAL_TABLET | Freq: Every day | ORAL | 0 refills | Status: DC

## 2022-02-23 NOTE — Patient Instructions (Addendum)
Restart counseling. Try to follow up with Zella Ball if in network.  ?Can try higher dose of wellbutrin, same dose buspar for now. Let me know if any issues with higher med dose.  ?Ritalin also increased for now.  ?Recheck in 6 weeks. ?Let me know if there are questions sooner.  ?

## 2022-02-23 NOTE — Progress Notes (Signed)
Subjective:  Patient ID: Ashley Gardner, female    DOB: 15-Oct-1981  Age: 41 y.o. MRN: [id number]  CC:  Chief Complaint  Patient presents with   Depression    Pt reports a need for increase or change in medications as she is having more bad days than good now   ADHD    Pt reports she feels an increase or change would be beneficial as she doesn't feel she is getting the same effects from the medication reports she has stopped this medication several months ago when she started weight loss clinic    Hypertension    Pt has been doing okay denies physical sxs     HPI Ashley Gardner presents for   History of palpitations Treated with metoprolol 25 mg daily stable, denies recent symptoms.  Depression with anxiety, ADHD.  Formal assessment for ADHD in October 2021 with ADHD, and mood disorder contributing to anxiety.  Treated with Wellbutrin 150 mg twice daily, BuSpar with various dosing changes previously.  When discussed in September she was off stimulants given treatment for weight loss and use of phentermine.  Option of Strattera but decided to hold on medications at that time.  Has met with therapist previously, cost prohibitive at last visit.  Discussed counseling through EAP program at work. Reports increased depression symptoms, more bad days recently. Wellbutrin 150mg  BID. Buspar 7.5mg  tid. Did not feel well on higher buspar doses. Feels more tearful. More depressed, some increased anxiety.  Does not want to meet with psychiatry.  Bad memories of mom with her psychiatrist. Rare alcohol - once per week. No CBD/marijuana.  Off phentermine past few months.  Started back on ritalin 10mg  at 6:30am, 2nd dose at 2pm in January. Off past 2 months. Not working as well - trouble focusing. Some days better than others. 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 Still decreased focus.   Depression screen [facility] 2/9 02/23/2022 08/25/2021 05/03/2021 02/03/2021 11/11/2020  Decreased  Interest 1 1 0 1 3  Down, Depressed, Hopeless 1 1 1 1 1   PHQ - 2 Score 2 2 1 2 4   Altered sleeping 2 3 1 3 3   Tired, decreased energy 3 3 1 3 3   Change in appetite 3 1 2 3  0  Feeling bad or failure about yourself  2 2 0 1 3  Trouble concentrating 3 3 1 3 3   Moving slowly or fidgety/restless 0 0 0 1 2  Suicidal thoughts 0 0 0 0 0  PHQ-9 Score 15 14 6 16 18   Difficult doing work/chores - - - - -    History Patient Active Problem List   Diagnosis Date Noted   ADD (attention deficit disorder) 09/2020   Family history of early CAD 09/30/2017   Elevated blood pressure reading 08/06/2017   Generalized anxiety disorder 08/06/2017   Insomnia 08/06/2017   Current severe episode of major depressive disorder without psychotic features without prior episode ([facility]) 08/06/2017   Past Medical History:  Diagnosis Date   Anxiety    Decreased concentration, Excessive worry, insomnia, irritability, nervous/anxious behavior,obessions(food) restlessness.   Depression    Feelings of hopelessness, feeling of worthlessness   Hyperlipidemia    Hypersomnia    Palpitations    Restlessness    Past Surgical History:  Procedure Laterality Date   LEFT MASS BIOPSY     Benign appearing fragments of  bone and fibrous tissue, deferential     Allergies  Allergen Reactions  Penicillins Hives   Prior to Admission medications   Medication Sig Start Date End Date Taking? Authorizing Provider  buPROPion (WELLBUTRIN SR) 150 MG 12 hr tablet TAKE 1 TABLET BY MOUTH TWICE A DAY 11/03/21  Yes Shade Flood, MD  busPIRone (BUSPAR) 15 MG tablet Take 0.5 tablets (7.5 mg total) by mouth 3 (three) times daily. 08/25/21  Yes Shade Flood, MD  metoprolol succinate (TOPROL-XL) 25 MG 24 hr tablet Take 1 tablet (25 mg total) by mouth daily. 08/25/21  Yes Shade Flood, MD  norethindrone-ethinyl estradiol-FE (LOESTRIN FE) 1-20 MG-MCG tablet Take 1 tablet by mouth daily.   Yes [provider]   methylphenidate (RITALIN) 10 MG tablet Take 1 tablet (10 mg total) by mouth 2 (two) times daily with breakfast and lunch. Patient not taking: Reported on 08/25/2021 05/03/21   Shade Flood, MD  methylphenidate (RITALIN) 10 MG tablet Take 1 tablet (10 mg total) by mouth 2 (two) times daily. Patient not taking: Reported on 08/25/2021 05/03/21   Shade Flood, MD  methylphenidate (RITALIN) 10 MG tablet Take 1 tablet (10 mg total) by mouth 2 (two) times daily. Patient not taking: Reported on 08/25/2021 05/03/21   Shade Flood, MD   Social History   Socioeconomic History   Marital status: Divorced    Spouse name: Not on file   Number of children: Not on file   Years of education: Not on file   Highest education level: Not on file  Occupational History   Not on file  Tobacco Use   Smoking status: Never   Smokeless tobacco: Never  Substance and Sexual Activity   Alcohol use: Yes   Drug use: No   Sexual activity: Not on file  Other Topics Concern   Not on file  Social History Narrative   Not on file   Social Determinants of Health   Financial Resource Strain: Not on file  Food Insecurity: Not on file  Transportation Needs: Not on file  Physical Activity: Not on file  Stress: Not on file  Social Connections: Not on file  Intimate Partner Violence: Not on file    Review of Systems   Objective:   Vitals:   02/23/22 0829  BP: 136/78  Pulse: 86  Resp: 15  Temp: 98 F (36.7 C)  TempSrc: Temporal  SpO2: 99%  Weight: 161 lb 12.8 oz (73.4 kg)  Height: 5' (1.524 m)    Physical Exam Vitals reviewed.  Constitutional:      Appearance: Normal appearance. She is well-developed.  HENT:     Head: Normocephalic and atraumatic.  Eyes:     Conjunctiva/sclera: Conjunctivae normal.     Pupils: Pupils are equal, round, and reactive to light.  Neck:     Vascular: No carotid bruit.  Cardiovascular:     Rate and Rhythm: Normal rate and regular rhythm.     Heart sounds:  Normal heart sounds.  Pulmonary:     Effort: Pulmonary effort is normal.     Breath sounds: Normal breath sounds.  Abdominal:     Palpations: Abdomen is soft. There is no pulsatile mass.     Tenderness: There is no abdominal tenderness.  Musculoskeletal:     Right lower leg: No edema.     Left lower leg: No edema.  Skin:    General: Skin is warm and dry.  Neurological:     Mental Status: She is alert and oriented to person, place, and time.  Psychiatric:  Mood and Affect: Mood normal.        Behavior: Behavior normal.      Assessment & Plan:  Ashley Gardner is a 41 y.o. female . Depression with anxiety - Plan: busPIRone (BUSPAR) 15 MG tablet, buPROPion (WELLBUTRIN SR) 200 MG 12 hr tablet  -Decreased control, will try higher dose of bupropion with potential increased anxiety symptoms as possible side effect discussed.  However did not tolerate higher dose BuSpar previously.  Discussed psychiatry referral as option for medication adjustment but declined at this time.  Recheck 6 weeks.  History of palpitations - Plan: metoprolol succinate (TOPROL-XL) 25 MG 24 hr tablet Stable, continue Toprol  Attention deficit hyperactivity disorder (ADHD), combined type - Plan: methylphenidate (RITALIN) 20 MG tablet, methylphenidate (RITALIN) 20 MG tablet Decreased focus on Ritalin 10 mg, will try higher dose.  Question focus issue from anxiety/depression versus ADD which may also be contributing to symptoms above.  Initial trial higher dose with 6-week follow-up.  Meds ordered this encounter  Medications   busPIRone (BUSPAR) 15 MG tablet    Sig: Take 0.5 tablets (7.5 mg total) by mouth 3 (three) times daily.    Dispense:  135 tablet    Refill:  1   metoprolol succinate (TOPROL-XL) 25 MG 24 hr tablet    Sig: Take 1 tablet (25 mg total) by mouth daily.    Dispense:  90 tablet    Refill:  0   methylphenidate (RITALIN) 20 MG tablet    Sig: Take 1 tablet (20 mg total) by mouth 2 (two)  times daily with breakfast and lunch.    Dispense:  60 tablet    Refill:  0   methylphenidate (RITALIN) 20 MG tablet    Sig: Take 1 tablet (20 mg total) by mouth 2 (two) times daily with breakfast and lunch.    Dispense:  60 tablet    Refill:  0    Fill in 30 days.   buPROPion (WELLBUTRIN SR) 200 MG 12 hr tablet    Sig: Take 1 tablet (200 mg total) by mouth 2 (two) times daily. TAKE 1 TABLET BY MOUTH TWICE A DAY    Dispense:  60 tablet    Refill:  1   Patient Instructions  Restart counseling. Try to follow up with [name] if in network.  Can try higher dose of wellbutrin, same dose buspar for now. Let me know if any issues with higher med dose.  Ritalin also increased for now.  Recheck in 6 weeks. Let me know if there are questions sooner.     Signed,   Meredith Staggers, MD [facility], [facility] 02/23/22 9:21 AM

## 2022-03-05 ENCOUNTER — Encounter (HOSPITAL_BASED_OUTPATIENT_CLINIC_OR_DEPARTMENT_OTHER): Payer: Self-pay

## 2022-03-05 ENCOUNTER — Emergency Department (HOSPITAL_BASED_OUTPATIENT_CLINIC_OR_DEPARTMENT_OTHER)
Admission: EM | Admit: 2022-03-05 | Discharge: 2022-03-06 | Disposition: A | Discharge status: Home / Self Care | Payer: Commercial Managed Care - PPO | Attending: Emergency Medicine | Admitting: Emergency Medicine

## 2022-03-05 ENCOUNTER — Other Ambulatory Visit: Payer: Self-pay

## 2022-03-05 DIAGNOSIS — R0989 Other specified symptoms and signs involving the circulatory and respiratory systems: Secondary | ICD-10-CM | POA: Insufficient documentation

## 2022-03-05 NOTE — ED Triage Notes (Signed)
Pt arrives with her boyfriend with c/o of feeling like something is stuck in her throat. ? ?Pt states this has been going on for "awhile", maybe for about a year. ? ?She feels this sensation constantly but she doesn't feel it as much some days, it is not "as annoying" some days. ? ?Today she states the feeling has been worse and wouldn't go away.  She drank some water and it got slightly better but has gotten worse tonight. ? ?She denies throat pain or difficulty swallowing and states she can eat and drink normally. ? ?States she feels like she is going to "gag" and vomit, but she hasn't. ?

## 2022-03-06 ENCOUNTER — Emergency Department (HOSPITAL_BASED_OUTPATIENT_CLINIC_OR_DEPARTMENT_OTHER): Payer: Commercial Managed Care - PPO

## 2022-03-06 ENCOUNTER — Encounter: Payer: Self-pay | Admitting: Family Medicine

## 2022-03-06 MED ORDER — OMEPRAZOLE 20 MG PO CPDR: 20.0000 mg | DELAYED_RELEASE_CAPSULE | Freq: Every day | ORAL | 1 refills | Status: DC

## 2022-03-06 NOTE — ED Provider Notes (Signed)
?[facility] ?[facility] Emergency Department ?Provider Note ?MRN:  [id number]  ?Arrival date & time: 03/06/22    ? ?Chief Complaint   ?Globus sensation ?History of Present Illness   ?Ashley Gardner is a 41 y.o. year-old female with  presenting to the ED with chief complaint of globus sensation. ? ?Globus sensation in throat for 1 year, worse today, not going away.  Feels like something is in her throat.  Did not get worse after eating something, denies pain. ? ?Review of Systems  ?A thorough review of systems was obtained and all systems are negative except as noted in the HPI and PMH.  ? ?Patient's Health History   ? ?Past Medical History:  ?Diagnosis Date  ? Anxiety   ? Decreased concentration, Excessive worry, insomnia, irritability, nervous/anxious behavior,obessions(food) restlessness.  ? Depression   ? Feelings of hopelessness, feeling of worthlessness  ? Hyperlipidemia   ? Hypersomnia   ? Palpitations   ? Restlessness   ?  ?Past Surgical History:  ?Procedure Laterality Date  ? LEFT MASS BIOPSY    ? Benign appearing fragments of  bone and fibrous tissue, deferential    ?  ?Family History  ?Problem Relation Age of Onset  ? Bipolar disorder Mother   ? COPD Mother   ? Depression Mother   ? Diabetes Mother   ? Heart attack Mother   ? Hypertension Mother   ? Heart attack Father   ? Hyperlipidemia Father   ? Hypertension Father   ? Stroke Father   ?  ?Social History  ? ?Socioeconomic History  ? Marital status: Divorced  ?  Spouse name: Not on file  ? Number of children: Not on file  ? Years of education: Not on file  ? Highest education level: Not on file  ?Occupational History  ? Not on file  ?Tobacco Use  ? Smoking status: Never  ? Smokeless tobacco: Never  ?Vaping Use  ? Vaping Use: Never used  ?Substance and Sexual Activity  ? Alcohol use: Yes  ?  Comment: occ  ? Drug use: No  ? Sexual activity: Yes  ?  Birth control/protection: Pill  ?Other Topics Concern  ? Not on file  ?Social History  Narrative  ? Not on file  ? ?Social Determinants of Health  ? ?Financial Resource Strain: Not on file  ?Food Insecurity: Not on file  ?Transportation Needs: Not on file  ?Physical Activity: Not on file  ?Stress: Not on file  ?Social Connections: Not on file  ?Intimate Partner Violence: Not on file  ?  ? ?Physical Exam  ? ?Vitals:  ? 03/05/22 2345 03/06/22 0030  ?BP: (!) 135/100 (!) 138/97  ?Pulse: 84 95  ?Resp: 18 18  ?Temp:    ?SpO2: 99% 100%  ?  ?CONSTITUTIONAL: Well-appearing, NAD ?NEURO/PSYCH:  Alert and oriented x 3, no focal deficits ?EYES:  eyes equal and reactive ?ENT/NECK:  no LAD, no JVD ?CARDIO: Regular rate, well-perfused, normal S1 and S2 ?PULM:  CTAB no wheezing or rhonchi ?GI/GU:  non-distended, non-tender ?MSK/SPINE:  No gross deformities, no edema ?SKIN:  no rash, atraumatic ? ? ?*Additional and/or pertinent findings included in MDM below ? ?Diagnostic and Interventional Summary  ? ? EKG Interpretation ? ?Date/Time:    ?Ventricular Rate:    ?PR Interval:    ?QRS Duration:   ?QT Interval:    ?QTC Calculation:   ?R Axis:     ?Text Interpretation:   ?  ? ?  ? ?Labs Reviewed -  No data to display  ?CT Soft Tissue Neck Wo Contrast  ?Final Result  ?  ?  ?Medications - No data to display  ? ?Procedures  /  Critical Care ?Procedures ? ?ED Course and Medical Decision Making  ?Initial Impression and Ddx ?Well-appearing 41 year old female otherwise healthy and with acute on chronic globus sensation in the throat.  Vital signs normal, no palpable mass on exam, no supraclavicular lymphadenopathy.  Shared decision making utilized, CT imaging obtained to exclude mass or obvious foreign body and CT is normal.  Will refer to ENT.  GERD a possible etiology, will treat. ? ?Past medical/surgical history that increases complexity of ED encounter: None ? ?Interpretation of Diagnostics ?See above. ? ?Patient Reassessment and Ultimate Disposition/Management ?Discharge home ? ?Patient management required discussion with the  following services or consulting groups:  None ? ?Complexity of Problems Addressed ?Acute illness or injury that poses threat of life of bodily function ? ?Additional Data Reviewed and Analyzed ?Further history obtained from: ?None ? ?Additional Factors Impacting ED Encounter Risk ?Prescriptions ? ?Elmer Sow. Pilar Plate, MD ?[facility] Emergency Medicine ?[facility] [email or web address] ? ?Final Clinical Impressions(s) / ED Diagnoses  ? ?  ICD-10-CM   ?1. Globus sensation  R09.89   ?  ?  ?ED Discharge Orders   ? ?      Ordered  ?  omeprazole (PRILOSEC) 20 MG capsule  Daily       ? 03/06/22 0236  ? ?  ?  ? ?  ?  ? ?Discharge Instructions Discussed with and Provided to Patient:  ? ? ?Discharge Instructions   ? ?  ?You were evaluated in the Emergency Department and after careful evaluation, we did not find any emergent condition requiring admission or further testing in the hospital. ? ?Your exam/testing today is overall reassuring.  CT scan did not show any abnormalities.  Recommend taking the omeprazole daily to treat for any possible acid reflux and recommend follow-up with the ENT specialists if symptoms continue. ? ?Please return to the Emergency Department if you experience any worsening of your condition.   Thank you for allowing [place] to be a part of your care. ? ? ? ?  ?Sabas Sous, MD ?03/06/22 [phone] ? ?

## 2022-03-06 NOTE — ED Notes (Signed)
Patient transported to CT 

## 2022-03-06 NOTE — ED Notes (Signed)
Pt reports " I feel like something is in my throat been going on x 1 year, some days are better than others.  Tonight feeling got worse, tried to drink water but no relief.  Does report nausea at times.  Waiting to be seen by EDP ?

## 2022-03-06 NOTE — Discharge Instructions (Signed)
You were evaluated in the Emergency Department and after careful evaluation, we did not find any emergent condition requiring admission or further testing in the hospital. ? ?Your exam/testing today is overall reassuring.  CT scan did not show any abnormalities.  Recommend taking the omeprazole daily to treat for any possible acid reflux and recommend follow-up with the ENT specialists if symptoms continue. ? ?Please return to the Emergency Department if you experience any worsening of your condition.   Thank you for allowing [place] to be a part of your care. ?

## 2022-03-07 NOTE — Telephone Encounter (Signed)
Pt requesting you refer to Gastroenterology due to recent ED visit related to possible GERD with trouble swallowing  ?GERD not recently addressed at a visit. Please advise  ?

## 2022-03-18 ENCOUNTER — Other Ambulatory Visit: Payer: Self-pay | Admitting: Family Medicine

## 2022-03-18 DIAGNOSIS — F418 Other specified anxiety disorders: Secondary | ICD-10-CM

## 2022-03-30 ENCOUNTER — Other Ambulatory Visit: Payer: Self-pay | Admitting: Family Medicine

## 2022-03-30 DIAGNOSIS — F902 Attention-deficit hyperactivity disorder, combined type: Secondary | ICD-10-CM

## 2022-03-30 NOTE — Telephone Encounter (Signed)
Patient is requesting a refill of the following medications: ?Requested Prescriptions  ? ?Pending Prescriptions Disp Refills  ? methylphenidate (RITALIN) 20 MG tablet 60 tablet 0  ?  Sig: Take 1 tablet (20 mg total) by mouth 2 (two) times daily with breakfast and lunch.  ? ? ?Date of patient request: 03/30/22 ?Last office visit: 02/23/22 ?Date of last refill: 02/20/22 ?Last refill amount: 60 ? ? ?

## 2022-04-01 NOTE — Telephone Encounter (Signed)
Methylphenidate 20 mg #60 last filled 03/30/2022 based on controlled substance database.  Please clarify, this is a duplicate request? ?

## 2022-04-04 ENCOUNTER — Encounter: Payer: Self-pay | Admitting: Family Medicine

## 2022-04-04 ENCOUNTER — Ambulatory Visit (INDEPENDENT_AMBULATORY_CARE_PROVIDER_SITE_OTHER): Payer: Commercial Managed Care - PPO | Admitting: Family Medicine

## 2022-04-04 VITALS — BP 134/78 | HR 59 | Temp 98.2°F | Resp 16 | Ht 60.0 in | Wt 152.4 lb

## 2022-04-04 DIAGNOSIS — G4489 Other headache syndrome: Secondary | ICD-10-CM

## 2022-04-04 DIAGNOSIS — F418 Other specified anxiety disorders: Secondary | ICD-10-CM | POA: Diagnosis not present

## 2022-04-04 DIAGNOSIS — F902 Attention-deficit hyperactivity disorder, combined type: Secondary | ICD-10-CM

## 2022-04-04 NOTE — Telephone Encounter (Signed)
Pt will require new referral as the original provider is not taking new patients and she is considered new  ?

## 2022-04-04 NOTE — Progress Notes (Signed)
? ?Subjective:  ?Patient ID: Ashley Gardner, female    DOB: [birth year]  Age: 41 y.o. MRN: [id number] ? ?CC:  ?Chief Complaint  ?Patient presents with  ? Depression  ?  Pt reports increased dose in Wellbutrin and Ritalin are doing well no side effects noted other than possible headache but is also stopping caffeine use as well   ? ? ?HPI ?Ashley Gardner presents for  ? ?Depression with anxiety ?Follow-up March 17 visit.  Decreased control at that time.  We increased her Wellbutrin to 200 mg twice daily.  Was continued on Ritalin 20 mg twice daily for ADHD.  Continue same dose BuSpar (7.5mg  tid) as she did not tolerate higher dosing previously. ?Symptoms have improved with the changes.  No new side effects.  Some dry mouth at times, but increasing water intake. Slight headache.  Cutting back on caffeine - cut back from 6-7 caffeine drinks to none 1 month ago- HA, back on 1 drink per day.some soreness in neck with use of computer.  ?No n/v or limitations in activity with HA. Less naps during day. Sleeping ok overall.  ?Tx: occasional tylenol or advil. HA resolved this week.  ? ?Called therapist yesterday to schedule appt soon.  ? ? ? ?History ?Patient Active Problem List  ? Diagnosis Date Noted  ? ADD (attention deficit disorder) 09/2020  ? Family history of early CAD 09/30/2017  ? Elevated blood pressure reading 08/06/2017  ? Generalized anxiety disorder 08/06/2017  ? Insomnia 08/06/2017  ? Current severe episode of major depressive disorder without psychotic features without prior episode ([place]) 08/06/2017  ? ?Past Medical History:  ?Diagnosis Date  ? Anxiety   ? Decreased concentration, Excessive worry, insomnia, irritability, nervous/anxious behavior,obessions(food) restlessness.  ? Depression   ? Feelings of hopelessness, feeling of worthlessness  ? Hyperlipidemia   ? Hypersomnia   ? Palpitations   ? Restlessness   ? ?Past Surgical History:  ?Procedure Laterality Date  ? LEFT MASS BIOPSY    ? Benign appearing  fragments of  bone and fibrous tissue, deferential    ? ?Allergies  ?Allergen Reactions  ? Penicillins Hives  ? ?Prior to Admission medications   ?Medication Sig Start Date End Date Taking? Authorizing Provider  ?buPROPion (WELLBUTRIN SR) 200 MG 12 hr tablet Take 1 tablet (200 mg total) by mouth 2 (two) times daily. TAKE 1 TABLET BY MOUTH TWICE A DAY 02/23/22  Yes [name], MD  ?busPIRone (BUSPAR) 15 MG tablet Take 0.5 tablets (7.5 mg total) by mouth 3 (three) times daily. 02/23/22  Yes [name], MD  ?methylphenidate (RITALIN) 20 MG tablet Take 1 tablet (20 mg total) by mouth 2 (two) times daily with breakfast and lunch. 02/23/22  Yes [name], MD  ?methylphenidate (RITALIN) 20 MG tablet Take 1 tablet (20 mg total) by mouth 2 (two) times daily with breakfast and lunch. 02/23/22  Yes [name], MD  ?metoprolol succinate (TOPROL-XL) 25 MG 24 hr tablet Take 1 tablet (25 mg total) by mouth daily. 02/23/22  Yes [name], MD  ?norethindrone-ethinyl estradiol-FE (LOESTRIN FE) 1-20 MG-MCG tablet Take 1 tablet by mouth daily.   Yes [provider]  ?omeprazole (PRILOSEC) 20 MG capsule Take 1 capsule (20 mg total) by mouth daily. 03/06/22  Yes Maudie Flakes, MD  ? ?Social History  ? ?Socioeconomic History  ? Marital status: Divorced  ?  Spouse name: Not on file  ? Number of children: Not on file  ?  Years of education: Not on file  ? Highest education level: Not on file  ?Occupational History  ? Not on file  ?Tobacco Use  ? Smoking status: Never  ? Smokeless tobacco: Never  ?Vaping Use  ? Vaping Use: Never used  ?Substance and Sexual Activity  ? Alcohol use: Yes  ?  Comment: occ  ? Drug use: No  ? Sexual activity: Yes  ?  Birth control/protection: Pill  ?Other Topics Concern  ? Not on file  ?Social History Narrative  ? Not on file  ? ?Social Determinants of Health  ? ?Financial Resource Strain: Not on file  ?Food Insecurity: Not on file  ?Transportation Needs: Not on file   ?Physical Activity: Not on file  ?Stress: Not on file  ?Social Connections: Not on file  ?Intimate Partner Violence: Not on file  ? ? ?Review of Systems ?13 point review of systems per patient health survey noted.  Negative other than as indicated above or in HPI.  ? ? ?Objective:  ? ?Vitals:  ? 04/04/22 0817  ?BP: 134/78  ?Pulse: (!) 59  ?Resp: 16  ?Temp: 98.2 ?F (36.8 ?C)  ?TempSrc: Temporal  ?SpO2: 99%  ?Weight: 152 lb 6.4 oz (69.1 kg)  ?Height: 5' (1.524 m)  ? ? ? ?Physical Exam ?Vitals reviewed.  ?Constitutional:   ?   Appearance: Normal appearance. She is well-developed.  ?HENT:  ?   Head: Normocephalic and atraumatic.  ?Eyes:  ?   Conjunctiva/sclera: Conjunctivae normal.  ?   Pupils: Pupils are equal, round, and reactive to light.  ?Neck:  ?   Vascular: No carotid bruit.  ?Cardiovascular:  ?   Rate and Rhythm: Normal rate and regular rhythm.  ?   Heart sounds: Normal heart sounds.  ?Pulmonary:  ?   Effort: Pulmonary effort is normal.  ?   Breath sounds: Normal breath sounds.  ?Abdominal:  ?   Palpations: Abdomen is soft. There is no pulsatile mass.  ?   Tenderness: There is no abdominal tenderness.  ?Musculoskeletal:  ?   Right lower leg: No edema.  ?   Left lower leg: No edema.  ?Skin: ?   General: Skin is warm and dry.  ?Neurological:  ?   Mental Status: She is alert and oriented to person, place, and time.  ?   GCS: GCS eye subscore is 4. GCS verbal subscore is 5. GCS motor subscore is 6.  ?   Cranial Nerves: Dysarthria and facial asymmetry present.  ?   Motor: No weakness, tremor or pronator drift.  ?   Coordination: Coordination is intact.  ?   Gait: Gait is intact.  ?   Comments: Strength intact and equal upper extremities lower extremities bilaterally.  No focal weakness.  Nonfocal neurologic exam.  ?Psychiatric:     ?   Mood and Affect: Mood normal.     ?   Behavior: Behavior normal.     ?   Thought Content: Thought content normal.  ? ? ?Assessment & Plan:  ?Ashley Gardner is a 41 y.o. female  . ?Depression with anxiety ? -Improved, tolerating current regimen.  We will continue same dose Wellbutrin and BuSpar.  Also plans on setting up counseling/therapist as above.  Recheck 5 months. ? ?Attention deficit hyperactivity disorder (ADHD), combined type ?Stable control of current symptoms, tolerating Ritalin current dose.  Continue same.  48-month recheck ? ?Other headache syndrome ?New problem. Likely combination of caffeine withdrawal headache, tension headache, cervicogenic headache.  Exam  reassuring.  Mild headache, not debilitating.  Occasional Tylenol or NSAID if needed, range of motion of the neck throughout the day and discussed body positioning with use of computer.  RTC precautions given. ? ?No orders of the defined types were placed in this encounter. ? ?Patient Instructions  ?Glad to hear that the current medication combo is working well.  No changes for now.  Headache could be a combination of caffeine withdrawal, tension headache or cervicogenic headache (headache from neck and positioning with computer).  Pressing alarm to move neck throughout the day when staring at computer.  Tylenol and occasional Advil or Aleve are okay but recommend not taking it as frequently as rebound headache can occur.  Recheck in 5 months unless there are any concerns or questions sooner.  Take care.  ? ?Return to the clinic or go to the nearest emergency room if any of your symptoms worsen or new symptoms occur. ? ? ? ? ?Signed,  ? ?Merri Ray, MD ?[place], [facility] ?[facility] ?04/04/22 ?8:54 AM ? ? ?

## 2022-04-04 NOTE — Patient Instructions (Addendum)
Glad to hear that the current medication combo is working well.  No changes for now.  Headache could be a combination of caffeine withdrawal, tension headache or cervicogenic headache (headache from neck and positioning with computer).  Try to set alarm to move neck throughout the day when on your computer.  Tylenol and occasional Advil or Aleve are okay but recommend not taking frequently as rebound headache can occur.  Recheck in 5 months unless there are any concerns or questions sooner.  Take care.  ? ?Return to the clinic or go to the nearest emergency room if any of your symptoms worsen or new symptoms occur. ? ?

## 2022-04-18 IMAGING — MG MM DIGITAL DIAGNOSTIC UNILAT*R* W/ TOMO W/ CAD
4 series · 4 of 12 positions shown · non-contrast
Comparison: Baseline screening mammogram dated 06/26/2021.
COMPARISON: Baseline screening mammogram dated 06/26/2021.

Addendum:
CLINICAL DATA: Nipple possible asymmetry in the posterior central
right breast in the oblique projection of a recent baseline
screening mammogram.

EXAM:
DIGITAL DIAGNOSTIC UNILATERAL RIGHT MAMMOGRAM WITH TOMOSYNTHESIS AND
CAD
TECHNIQUE: Right digital diagnostic mammography and breast tomosynthesis was
performed. The images were evaluated with computer-aided detection.

[R MLO synth-2D]
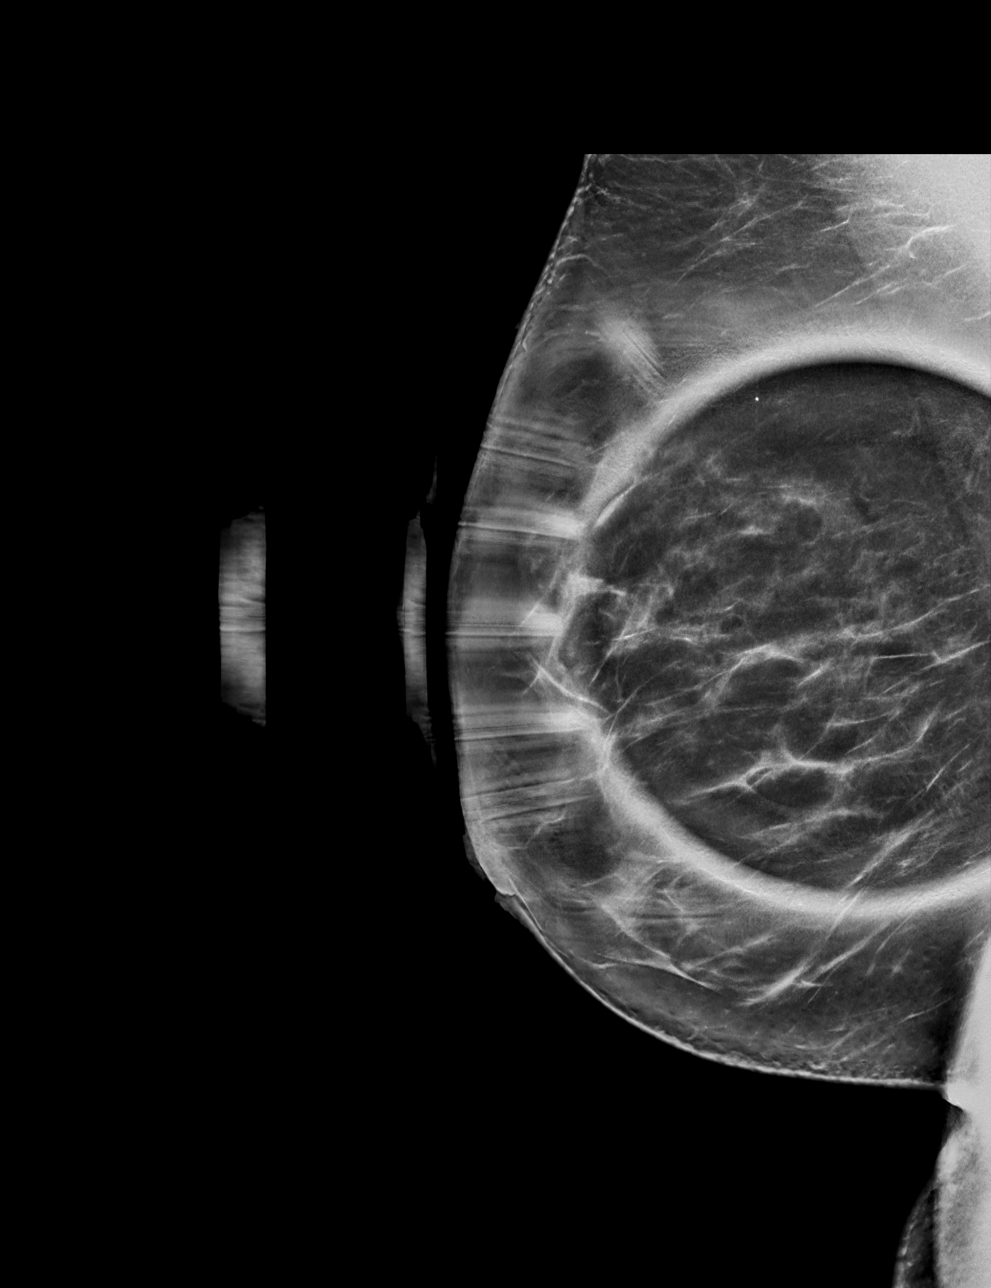

[R ML synth-2D]
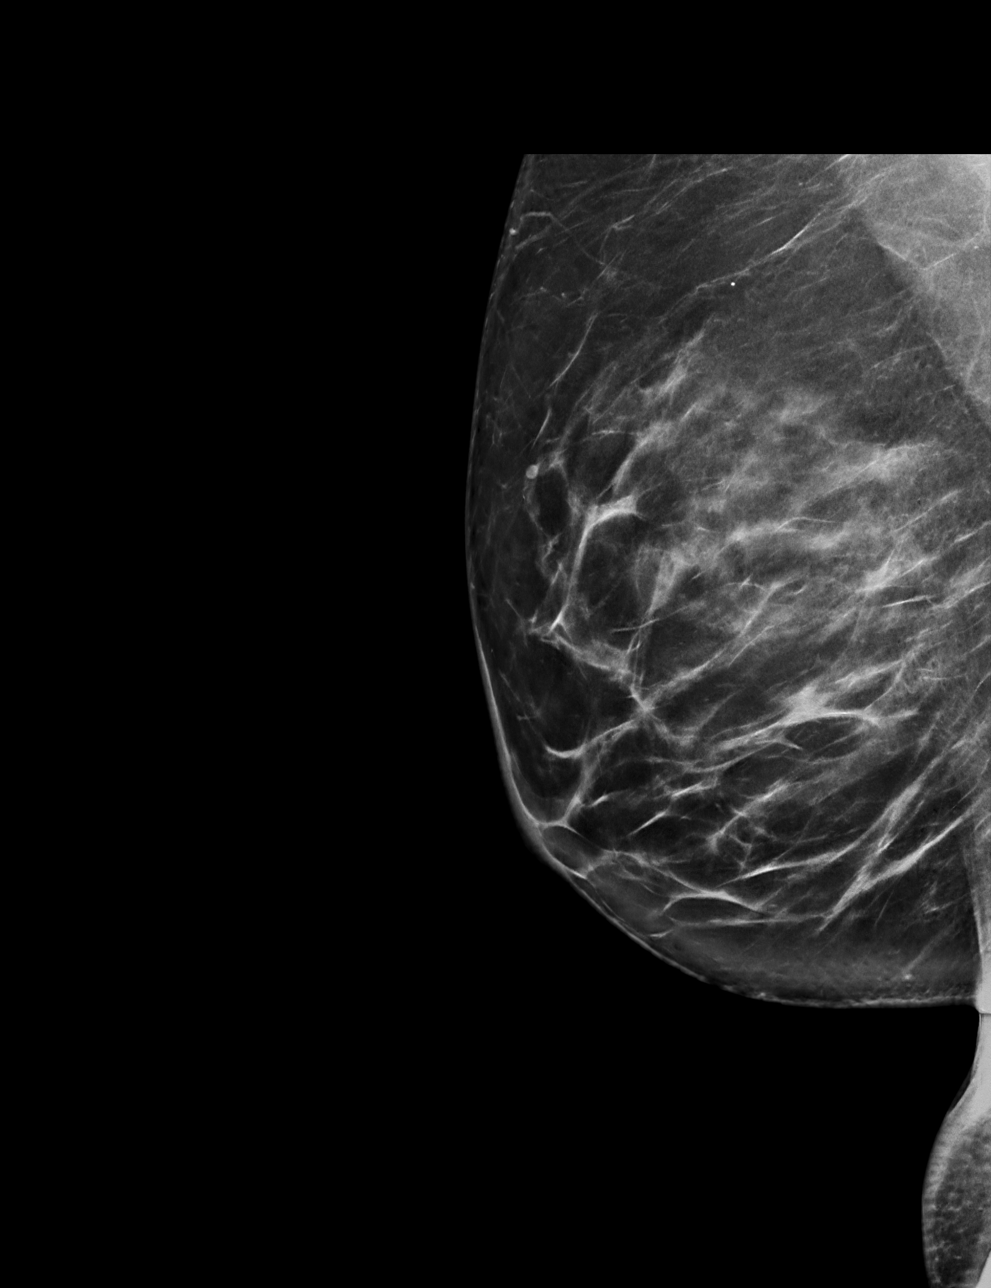

[R MLO tomo · tomo slice 43/86.0]
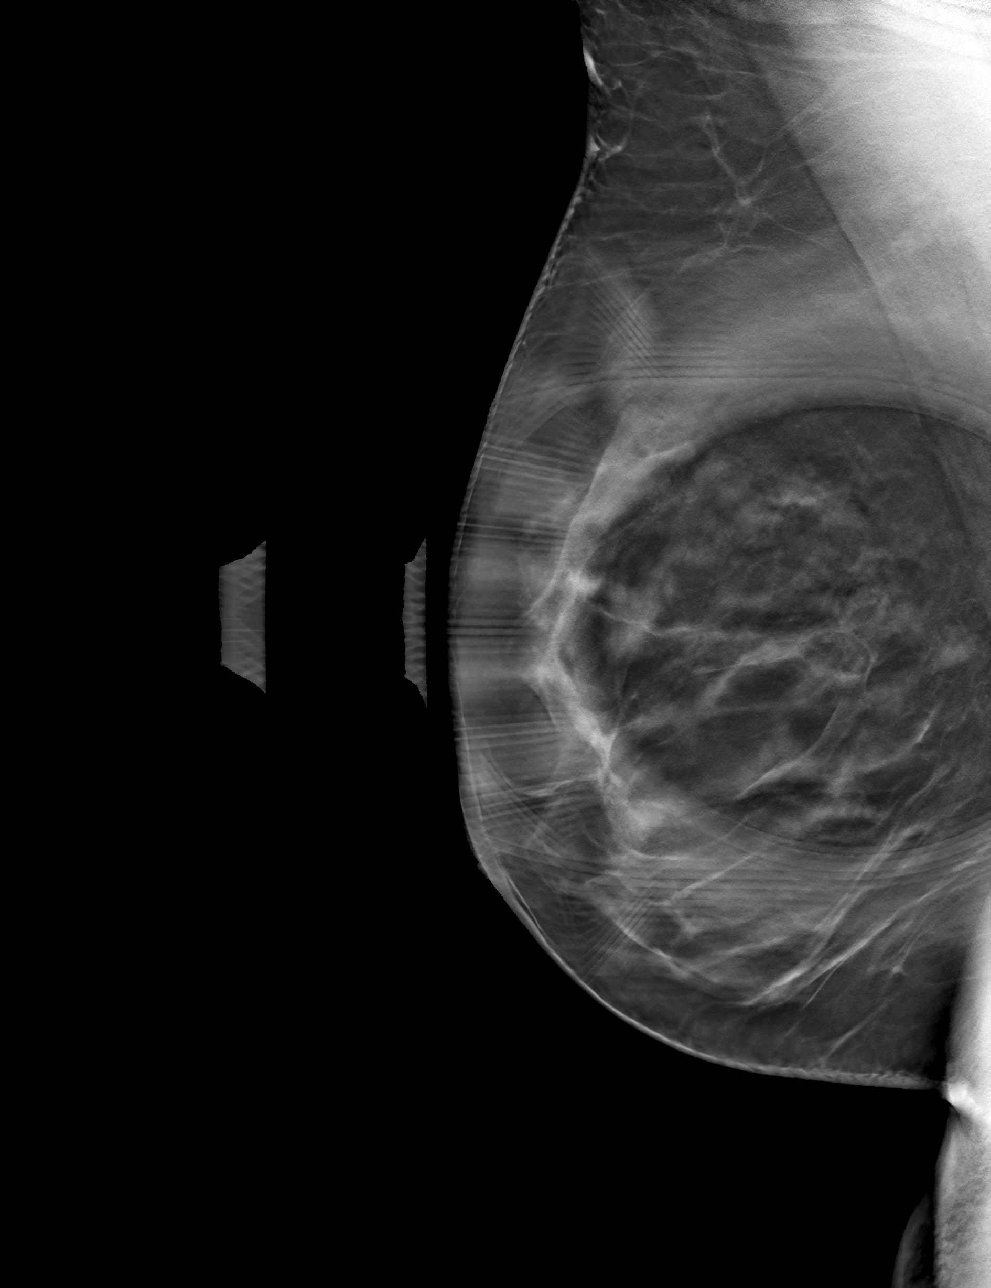

[R ML tomo · tomo slice 45/88.0]
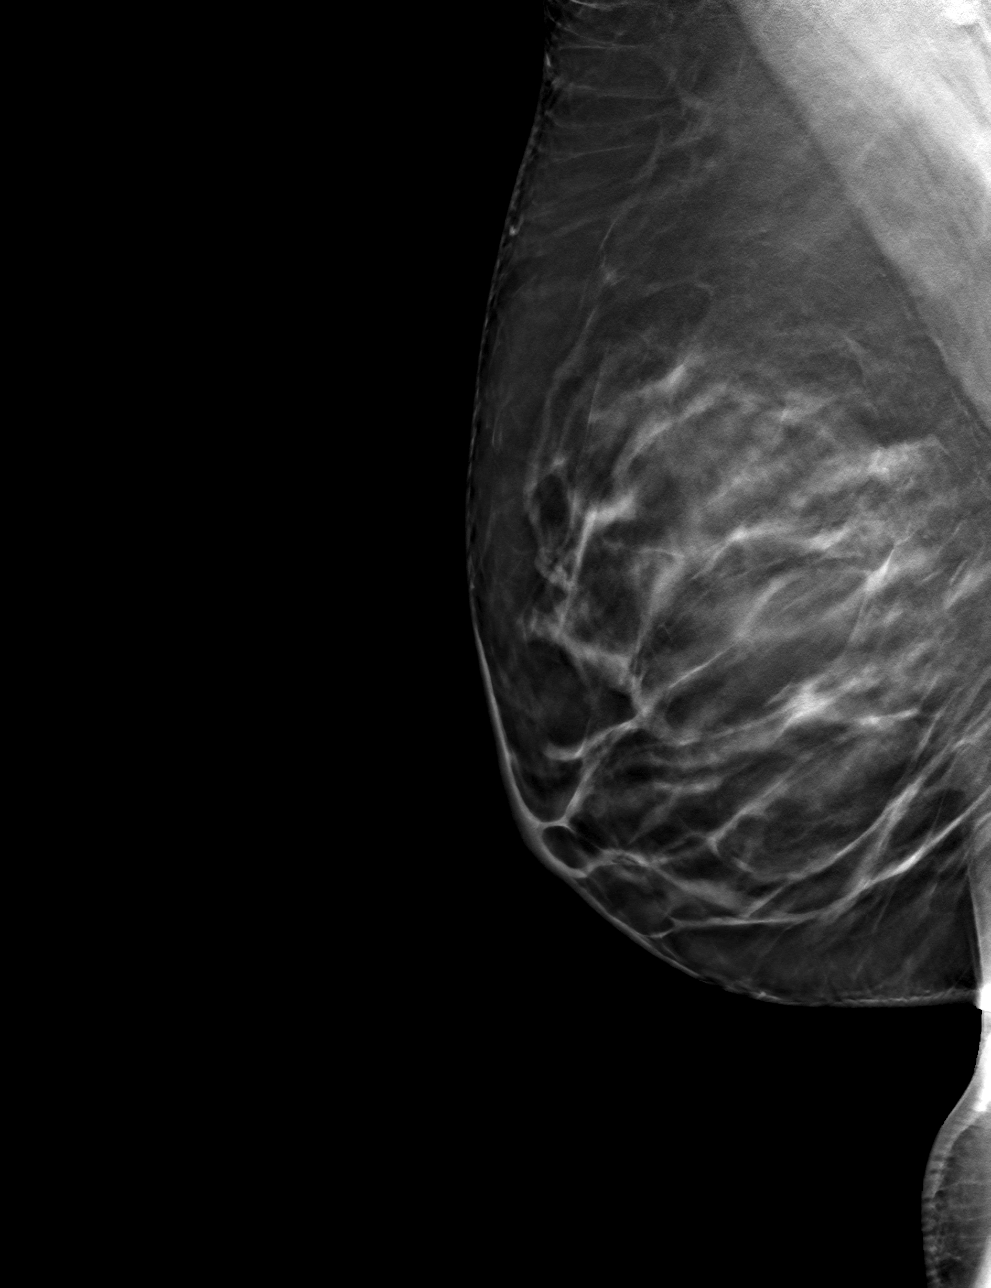

[4 of 12 positions shown; findings below may reference images not displayed]

ACR Breast Density Category c: The breast tissue is heterogeneously
dense, which may obscure small masses.
FINDINGS: 3D tomographic and 2D generated true lateral and spot compression
oblique images of the right breast demonstrate normal appearing
fibroglandular tissue at the location of the recently suspected
asymmetry.
IMPRESSION: No evidence of malignancy. The recently suspected right breast
asymmetry was close apposition of normal breast tissue.

RECOMMENDATION:
Bilateral screening mammogram in 1 year when due.

I have discussed the findings and recommendations with the patient.
If applicable, a reminder letter will be sent to the patient
regarding the next appointment.

BI-RADS CATEGORY  1: Negative.

ADDENDUM:
The clinical data should read as follows: Possible asymmetry in the
posterior central right breast in the oblique projection of a recent
baseline screening mammogram.

*** End of Addendum ***
ACR Breast Density Category c: The breast tissue is heterogeneously
dense, which may obscure small masses.
FINDINGS: 3D tomographic and 2D generated true lateral and spot compression
oblique images of the right breast demonstrate normal appearing
fibroglandular tissue at the location of the recently suspected
asymmetry.
IMPRESSION: No evidence of malignancy. The recently suspected right breast
asymmetry was close apposition of normal breast tissue.

RECOMMENDATION:
Bilateral screening mammogram in 1 year when due.

I have discussed the findings and recommendations with the patient.
If applicable, a reminder letter will be sent to the patient
regarding the next appointment.

BI-RADS CATEGORY  1: Negative.

## 2022-04-21 ENCOUNTER — Other Ambulatory Visit: Payer: Self-pay | Admitting: Family Medicine

## 2022-04-21 DIAGNOSIS — F418 Other specified anxiety disorders: Secondary | ICD-10-CM

## 2022-05-02 ENCOUNTER — Encounter: Payer: Self-pay | Admitting: Family Medicine

## 2022-05-02 ENCOUNTER — Other Ambulatory Visit: Payer: Self-pay | Admitting: Family Medicine

## 2022-05-02 DIAGNOSIS — F902 Attention-deficit hyperactivity disorder, combined type: Secondary | ICD-10-CM

## 2022-05-02 MED ORDER — METHYLPHENIDATE HCL 20 MG PO TABS: 20.0000 mg | ORAL_TABLET | Freq: Two times a day (BID) | ORAL | 0 refills | Status: DC

## 2022-05-02 NOTE — Telephone Encounter (Signed)
Controlled substance database (PDMP) reviewed. No concerns appreciated.  Last filled 03/30/22, OV to discuss meds 04/04/22.  Refills ordered.

## 2022-05-02 NOTE — Telephone Encounter (Signed)
Patient is requesting a refill of the following medications: Requested Prescriptions   Pending Prescriptions Disp Refills   methylphenidate (RITALIN) 20 MG tablet 60 tablet 0    Sig: Take 1 tablet (20 mg total) by mouth 2 (two) times daily with breakfast and lunch.   methylphenidate (RITALIN) 20 MG tablet 60 tablet 0    Sig: Take 1 tablet (20 mg total) by mouth 2 (two) times daily with breakfast and lunch.    Date of patient request: 05/02/22 Last office visit: 04/04/22 Date of last refill: 02/23/22 Last refill amount: 60

## 2022-08-16 ENCOUNTER — Other Ambulatory Visit: Payer: Self-pay | Admitting: Family Medicine

## 2022-08-16 DIAGNOSIS — F902 Attention-deficit hyperactivity disorder, combined type: Secondary | ICD-10-CM

## 2022-08-16 DIAGNOSIS — Z87898 Personal history of other specified conditions: Secondary | ICD-10-CM

## 2022-08-16 NOTE — Telephone Encounter (Signed)
Patient is requesting a refill of the following medications: Requested Prescriptions   Pending Prescriptions Disp Refills   methylphenidate (RITALIN) 20 MG tablet 60 tablet 0    Sig: Take 1 tablet (20 mg total) by mouth 2 (two) times daily with breakfast and lunch.    Date of patient request: 08/16/22 Last office visit: /04/04/22 Date of last refill: /05/02/22 Last refill amount: 60

## 2022-08-18 MED ORDER — METHYLPHENIDATE HCL 20 MG PO TABS: 20.0000 mg | ORAL_TABLET | Freq: Two times a day (BID) | ORAL | 0 refills | Status: DC

## 2022-08-18 NOTE — Telephone Encounter (Signed)
Medication discussed in April.  Appointment scheduled September 27.  Controlled substance database reviewed.  Last filled July 7, refill ordered.

## 2022-08-28 ENCOUNTER — Other Ambulatory Visit: Payer: Self-pay | Admitting: Family Medicine

## 2022-08-28 DIAGNOSIS — F418 Other specified anxiety disorders: Secondary | ICD-10-CM

## 2022-09-05 ENCOUNTER — Ambulatory Visit (INDEPENDENT_AMBULATORY_CARE_PROVIDER_SITE_OTHER): Payer: Commercial Managed Care - PPO | Admitting: Family Medicine

## 2022-09-05 ENCOUNTER — Encounter: Payer: Self-pay | Admitting: Family Medicine

## 2022-09-05 VITALS — BP 148/90 | HR 84 | Temp 98.7°F | Ht 60.0 in | Wt 143.2 lb

## 2022-09-05 DIAGNOSIS — Z8279 Family history of other congenital malformations, deformations and chromosomal abnormalities: Secondary | ICD-10-CM

## 2022-09-05 DIAGNOSIS — F902 Attention-deficit hyperactivity disorder, combined type: Secondary | ICD-10-CM

## 2022-09-05 DIAGNOSIS — F418 Other specified anxiety disorders: Secondary | ICD-10-CM

## 2022-09-05 DIAGNOSIS — G4700 Insomnia, unspecified: Secondary | ICD-10-CM

## 2022-09-05 DIAGNOSIS — R030 Elevated blood-pressure reading, without diagnosis of hypertension: Secondary | ICD-10-CM

## 2022-09-05 DIAGNOSIS — R519 Headache, unspecified: Secondary | ICD-10-CM

## 2022-09-05 DIAGNOSIS — Z87898 Personal history of other specified conditions: Secondary | ICD-10-CM

## 2022-09-05 DIAGNOSIS — H539 Unspecified visual disturbance: Secondary | ICD-10-CM

## 2022-09-05 NOTE — Progress Notes (Signed)
Subjective:  Patient ID: Ashley Gardner, female    DOB: 1981/07/14  Age: 41 y.o. MRN: [id number]  CC:  Chief Complaint  Patient presents with   Anxiety    PHQ9 - 8 GAD 9   ADHD   Headache    Pt states she has been having headaches for the last 3 weeks and some of the black words on the computer screen are green     HPI Ashley Gardner presents for   Depression with anxiety Treated with BuSpar, Wellbutrin.  Dosages have been adjusted to symptom control as well as tolerability, did not tolerate higher doses of BuSpar previously, continued 7.5 mg 3 times daily.  Therapist Zella Ball prior. No current therapist, but doing well currently - feels better than in a long time.  Still working 2 jobs. Meds working well, no new side effects.       09/05/2022    8:08 AM 04/04/2022    8:19 AM 02/23/2022    8:34 AM 08/25/2021   10:25 AM 05/03/2021   10:45 AM  Depression screen PHQ 2/9  Decreased Interest 1 1 1 1  0  Down, Depressed, Hopeless 1 1 1 1 1   PHQ - 2 Score 2 2 2 2 1   Altered sleeping 2 1 2 3 1   Tired, decreased energy 3 3 3 3 1   Change in appetite 0 1 3 1 2   Feeling bad or failure about yourself  0 1 2 2  0  Trouble concentrating 1 1 3 3 1   Moving slowly or fidgety/restless 0 0 0 0 0  Suicidal thoughts 0 0 0 0 0  PHQ-9 Score 8 9 15 14 6       09/05/2022    8:09 AM 05/03/2021   10:45 AM 02/03/2021    3:41 PM 11/11/2020    9:13 AM  GAD 7 : Generalized Anxiety Score  Nervous, Anxious, on Edge 1 1 2 2   Control/stop worrying 1 1 1 2   Worry too much - different things 1 1 1 2   Trouble relaxing 2 1 2 3   Restless 1 2 3 2   Easily annoyed or irritable 3 1 3 2   Afraid - awful might happen 0 0 0 0  Total GAD 7 Score 9 7 12 13     Attention deficit disorder Ritalin 20 mg twice daily working well. Rare off days. Initial dose 6am, then 2pm.  Trouble falling asleep - tries counting backwards. Tried stopping meds - slept better Appetite ok.  Was trying to lose weight with increased  exercise. No unexplained weight loss.  Wt Readings from Last 3 Encounters:  09/05/22 143 lb 3.2 oz (65 kg)  04/04/22 152 lb 6.4 oz (69.1 kg)  03/05/22 150 lb (68 kg)   Headache: Slight headache discussed at her last visit in April, was cutting back from 6-7 caffeinated drinks per day, then back to none, some headache with that decrease, then increase back to 1/day.  Did report some soreness in her neck with use of the computer.  No limitations in activity with headache time.  Thought to be caffeine withdrawal headache, tension headache, cervicogenic headache.  Tylenol, NSAID if needed, range of motion and RTC precautions were given.  HA daily. Over R eye - radiates to back pf head, then other times whole head. Noticed change in color of words on computer last week, but resolved and not recurred. Some floaters in vision, discussed with eye specialist few years ago. No blurry or dark vision,  no diplopia. No n/v. No weakness. Still functional with HA. Neck soreness at times. Has not yet adjusted computer workstation. No allergy sinus symptoms.  Tx: occasional ibuprofen few days per week - some relief. Heating pad for neck at times helps.  Caffeine - 1 per day. 64 oz water per day.  Father with AVM in brain, seizures.   Elevated blood pressure Elevated at March visit, normal in April.  Higher today.  Did have some caffeine earlier this morning. No recent labs.History of palpitations, treated with Toprol XL 25 mg daily, just took pill in parking lot today.   BP Readings from Last 3 Encounters:  09/05/22 (!) 148/90  04/04/22 134/78  03/06/22 (!) 138/97     History Patient Active Problem List   Diagnosis Date Noted   ADD (attention deficit disorder) 09/2020   Family history of early CAD 09/30/2017   Elevated blood pressure reading 08/06/2017   Generalized anxiety disorder 08/06/2017   Insomnia 08/06/2017   Current severe episode of major depressive disorder without psychotic features  without prior episode ([place]) 08/06/2017   Past Medical History:  Diagnosis Date   Anxiety    Decreased concentration, Excessive worry, insomnia, irritability, nervous/anxious behavior,obessions(food) restlessness.   Depression    Feelings of hopelessness, feeling of worthlessness   Hyperlipidemia    Hypersomnia    Palpitations    Restlessness    Past Surgical History:  Procedure Laterality Date   LEFT MASS BIOPSY     Benign appearing fragments of  bone and fibrous tissue, deferential     Allergies  Allergen Reactions   Penicillins Hives   Prior to Admission medications   Medication Sig Start Date End Date Taking? Authorizing Provider  buPROPion (WELLBUTRIN SR) 200 MG 12 hr tablet TAKE 1 TABLET (200 MG TOTAL) BY MOUTH 2 (TWO) TIMES DAILY. TAKE 1 TABLET BY MOUTH TWICE A DAY 04/23/22  Yes [name], MD  busPIRone (BUSPAR) 15 MG tablet TAKE 0.5 TABLETS (7.5 MG TOTAL) BY MOUTH 3 (THREE) TIMES DAILY. 08/28/22  Yes [name], MD  [place]-Eth Radene Journey 91-Day (SEASONIQUE PO) Take 1 Dose by mouth 1 day or 1 dose.   Yes [provider]  methylphenidate (RITALIN) 20 MG tablet Take 1 tablet (20 mg total) by mouth 2 (two) times daily with breakfast and lunch. 05/02/22  Yes [name], MD  methylphenidate (RITALIN) 20 MG tablet Take 1 tablet (20 mg total) by mouth 2 (two) times daily with breakfast and lunch. 08/18/22  Yes [name], MD  metoprolol succinate (TOPROL-XL) 25 MG 24 hr tablet TAKE 1 TABLET (25 MG TOTAL) BY MOUTH DAILY. 08/16/22  Yes [name], MD  norethindrone-ethinyl estradiol-FE (LOESTRIN FE) 1-20 MG-MCG tablet Take 1 tablet by mouth daily.   Yes [provider]  omeprazole (PRILOSEC) 20 MG capsule Take 1 capsule (20 mg total) by mouth daily. 03/06/22  Yes Bero, Barth Kirks, MD   Social History   Socioeconomic History   Marital status: Divorced    Spouse name: Not on file   Number of children: Not on file   Years of education:  Not on file   Highest education level: Not on file  Occupational History   Not on file  Tobacco Use   Smoking status: Never   Smokeless tobacco: Never  Vaping Use   Vaping Use: Never used  Substance and Sexual Activity   Alcohol use: Yes    Comment: occ   Drug use: No   Sexual  activity: Yes    Birth control/protection: Pill  Other Topics Concern   Not on file  Social History Narrative   Not on file   Social Determinants of Health   Financial Resource Strain: Not on file  Food Insecurity: Not on file  Transportation Needs: Not on file  Physical Activity: Not on file  Stress: Not on file  Social Connections: Not on file  Intimate Partner Violence: Not on file    Review of Systems Per HPI.   Objective:   Vitals:   09/05/22 0811 09/05/22 0858  BP: (!) 158/98 (!) 148/90  Pulse: 84   Temp: 98.7 F (37.1 C)   SpO2: 100%   Weight: 143 lb 3.2 oz (65 kg)   Height: 5' (1.524 m)      Physical Exam Vitals reviewed.  Constitutional:      Appearance: Normal appearance. She is well-developed.  HENT:     Head: Normocephalic and atraumatic.     Nose:     Comments: Minimal discomfort right frontal sinus percussion, otherwise nontender sinuses.  Equal facial movements.  No rash Eyes:     Extraocular Movements: Extraocular movements intact.     Conjunctiva/sclera: Conjunctivae normal.     Pupils: Pupils are equal, round, and reactive to light.  Neck:     Vascular: No carotid bruit.  Cardiovascular:     Rate and Rhythm: Normal rate and regular rhythm.     Heart sounds: Normal heart sounds.  Pulmonary:     Effort: Pulmonary effort is normal.     Breath sounds: Normal breath sounds.  Abdominal:     Palpations: Abdomen is soft. There is no pulsatile mass.     Tenderness: There is no abdominal tenderness.  Musculoskeletal:     Right lower leg: No edema.     Left lower leg: No edema.  Skin:    General: Skin is warm and dry.  Neurological:     General: No focal  deficit present.     Mental Status: She is alert and oriented to person, place, and time. Mental status is at baseline.     Cranial Nerves: No cranial nerve deficit.     Sensory: No sensory deficit.     Motor: No weakness.     Coordination: Coordination normal.     Gait: Gait normal.     Comments: No pronator drift, no focal weakness, nonfocal neurologic exam.  Psychiatric:        Mood and Affect: Mood normal.        Behavior: Behavior normal.     52 minutes spent during visit, including chart review, discussion of headache and family history of AVM, counseling and assimilation of information, exam, discussion of plan, and chart completion.    Assessment & Plan:  Ashley Gardner is a 41 y.o. female . Depression with anxiety  -Stable on current regimen, continue same dose Wellbutrin, BuSpar.  Attention deficit hyperactivity disorder (ADHD), combined type  -Overall stable symptom control but insomnia as below.  Trial of earlier dosing of second dose of Ritalin, or half dosing to improve insomnia.  RTC precautions.  Elevated blood pressure reading  -Mild elevation, currently on Toprol 25 mg daily for palpitations as above.  Home monitoring with option of double dosing of Toprol.  Recheck next few weeks.  Insomnia, unspecified type  -Improved on days without Ritalin dosing, likely due to Ritalin.  Trial of earlier second dose as above or half dosing.  Melatonin if needed.  RTC  precautions.  History of palpitations  -Continue Toprol, stable.  Daily headache - Plan: AMB referral to headache clinic, CT ANGIO HEAD W OR WO CONTRAST Vision changes - Plan: AMB referral to headache clinic, CT ANGIO HEAD W OR WO CONTRAST Family history of congenital malformation - Plan: AMB referral to headache clinic, CT ANGIO HEAD W OR WO CONTRAST  -Daily headache with transient visual changes as above.  Still could be multifactorial including tension headache, cervical source, but with family history of  arteriovenous malformation will check imaging with CTA, and refer to headache specialist.  ER/RTC precautions, recheck next few weeks.  No orders of the defined types were placed in this encounter.  Patient Instructions  You can try taking 2nd dose ritalin earlier at lunch. If still having sleep issues, then need to decrease 2nd dose to 10mg . Melatonin as option at bedtime as an option.  Keep a record of your blood pressures outside of the office and send me those readings by Mychart. If elevated then could try higher dose toprol (2 pills).    For the headache: Adjust the computer workstation, range of motion for neck and heating pad for neck ok.  Call your eye specialist about the floaters and change in color last week. If that happens again then be seen. See info below on headache in the meantime. I will refer you to neurologist to discuss these symptoms as well as ordered imaging. Follow up in next few weeks to discuss further and recheck blood pressure.  Return to the clinic or go to the nearest emergency room if any of your symptoms worsen or new symptoms occur.   General Headache Without Cause A headache is pain or discomfort felt around the head or neck area. There are many causes and types of headaches. A few common types include: Tension headaches. Migraine headaches. Cluster headaches. Chronic daily headaches. Sometimes, the specific cause of a headache may not be found. Follow these instructions at home: Watch your condition for any changes. Let your health care provider know about them. Take these steps to help with your condition: Managing pain     Take over-the-counter and prescription medicines only as told by your health care provider. Treatment may include medicines for pain that are taken by mouth or applied to the skin. Lie down in a dark, quiet room when you have a headache. Keep lights dim if bright lights bother you or make your headaches worse. If directed, put  ice on your head and neck area: Put ice in a plastic bag. Place a towel between your skin and the bag. Leave the ice on for 20 minutes, 2-3 times per day. Remove the ice if your skin turns bright red. This is very important. If you cannot feel pain, heat, or cold, you have a greater risk of damage to the area. If directed, apply heat to the affected area. Use the heat source that your health care provider recommends, such as a moist heat pack or a heating pad. Place a towel between your skin and the heat source. Leave the heat on for 20-30 minutes. Remove the heat if your skin turns bright red. This is especially important if you are unable to feel pain, heat, or cold. You have a greater risk of getting burned. Eating and drinking Eat meals on a regular schedule. If you drink alcohol: Limit how much you have to: 0-1 drink a day for women who are not pregnant. 0-2 drinks a day for men. Know  how much alcohol is in a drink. In the [place] one drink equals one 12 oz bottle of beer (355 mL), one 5 oz glass of wine (148 mL), or one 1 oz glass of hard liquor (44 mL). Stop drinking caffeine, or decrease the amount of caffeine you drink. Drink enough fluid to keep your urine pale yellow. General instructions  Keep a headache journal to help find out what may trigger your headaches. For example, write down: What you eat and drink. How much sleep you get. Any change to your diet or medicines. Try massage or other relaxation techniques. Limit stress. Sit up straight, and do not tense your muscles. Do not use any products that contain nicotine or tobacco. These products include cigarettes, chewing tobacco, and vaping devices, such as e-cigarettes. If you need help quitting, ask your health care provider. Exercise regularly as told by your health care provider. Sleep on a regular schedule. Get 7-9 hours of sleep each night, or the amount recommended by your health care provider. Keep all follow-up  visits. This is important. Contact a health care provider if: Medicine does not help your symptoms. You have a headache that is different from your usual headache. You have nausea or you vomit. You have a fever. Get help right away if: Your headache: Becomes severe quickly. Gets worse after moderate to intense physical activity. You have any of these symptoms: Repeated vomiting. Pain or stiffness in your neck. Changes to your vision. Pain in an eye or ear. Problems with speech. Muscular weakness or loss of muscle control. Loss of balance or coordination. You feel faint or pass out. You have confusion. You have a seizure. These symptoms may represent a serious problem that is an emergency. Do not wait to see if the symptoms will go away. Get medical help right away. Call your local emergency services (911 in the [place] Do not drive yourself to the hospital. Summary A headache is pain or discomfort felt around the head or neck area. There are many causes and types of headaches. In some cases, the cause may not be found. Keep a headache journal to help find out what may trigger your headaches. Watch your condition for any changes. Let your health care provider know about them. Contact a health care provider if you have a headache that is different from the usual headache, or if your symptoms are not helped by medicine. Get help right away if your headache becomes severe, you vomit, you have a loss of vision, you lose your balance, or you have a seizure. This information is not intended to replace advice given to you by your health care provider. Make sure you discuss any questions you have with your health care provider. Document Revised: 04/26/2021 Document Reviewed: 04/26/2021 Elsevier Patient Education  [place],   Merri Ray, MD [place] 09/05/22 9:02 AM

## 2022-09-05 NOTE — Patient Instructions (Addendum)
You can try taking 2nd dose ritalin earlier at lunch. If still having sleep issues, then need to decrease 2nd dose to 10mg . Melatonin as option at bedtime as an option.  Keep a record of your blood pressures outside of the office and send me those readings by Mychart. If elevated then try taking 2 of the toprol per day.    For the headache: Adjust the computer workstation, range of motion for neck and heating pad for neck ok.  Call your eye specialist about the floaters and change in color last week. If that happens again then be seen. See info below on headache in the meantime. I will refer you to neurologist to discuss these symptoms as well as ordered imaging. Follow up in next few weeks to discuss further and recheck blood pressure.  Return to the clinic or go to the nearest emergency room if any of your symptoms worsen or new symptoms occur.   General Headache Without Cause A headache is pain or discomfort felt around the head or neck area. There are many causes and types of headaches. A few common types include: Tension headaches. Migraine headaches. Cluster headaches. Chronic daily headaches. Sometimes, the specific cause of a headache may not be found. Follow these instructions at home: Watch your condition for any changes. Let your health care provider know about them. Take these steps to help with your condition: Managing pain     Take over-the-counter and prescription medicines only as told by your health care provider. Treatment may include medicines for pain that are taken by mouth or applied to the skin. Lie down in a dark, quiet room when you have a headache. Keep lights dim if bright lights bother you or make your headaches worse. If directed, put ice on your head and neck area: Put ice in a plastic bag. Place a towel between your skin and the bag. Leave the ice on for 20 minutes, 2-3 times per day. Remove the ice if your skin turns bright red. This is very important. If  you cannot feel pain, heat, or cold, you have a greater risk of damage to the area. If directed, apply heat to the affected area. Use the heat source that your health care provider recommends, such as a moist heat pack or a heating pad. Place a towel between your skin and the heat source. Leave the heat on for 20-30 minutes. Remove the heat if your skin turns bright red. This is especially important if you are unable to feel pain, heat, or cold. You have a greater risk of getting burned. Eating and drinking Eat meals on a regular schedule. If you drink alcohol: Limit how much you have to: 0-1 drink a day for women who are not pregnant. 0-2 drinks a day for men. Know how much alcohol is in a drink. In the [place] one drink equals one 12 oz bottle of beer (355 mL), one 5 oz glass of wine (148 mL), or one 1 oz glass of hard liquor (44 mL). Stop drinking caffeine, or decrease the amount of caffeine you drink. Drink enough fluid to keep your urine pale yellow. General instructions  Keep a headache journal to help find out what may trigger your headaches. For example, write down: What you eat and drink. How much sleep you get. Any change to your diet or medicines. Try massage or other relaxation techniques. Limit stress. Sit up straight, and do not tense your muscles. Do not use any products that contain  nicotine or tobacco. These products include cigarettes, chewing tobacco, and vaping devices, such as e-cigarettes. If you need help quitting, ask your health care provider. Exercise regularly as told by your health care provider. Sleep on a regular schedule. Get 7-9 hours of sleep each night, or the amount recommended by your health care provider. Keep all follow-up visits. This is important. Contact a health care provider if: Medicine does not help your symptoms. You have a headache that is different from your usual headache. You have nausea or you vomit. You have a fever. Get help right  away if: Your headache: Becomes severe quickly. Gets worse after moderate to intense physical activity. You have any of these symptoms: Repeated vomiting. Pain or stiffness in your neck. Changes to your vision. Pain in an eye or ear. Problems with speech. Muscular weakness or loss of muscle control. Loss of balance or coordination. You feel faint or pass out. You have confusion. You have a seizure. These symptoms may represent a serious problem that is an emergency. Do not wait to see if the symptoms will go away. Get medical help right away. Call your local emergency services (911 in the [place] Do not drive yourself to the hospital. Summary A headache is pain or discomfort felt around the head or neck area. There are many causes and types of headaches. In some cases, the cause may not be found. Keep a headache journal to help find out what may trigger your headaches. Watch your condition for any changes. Let your health care provider know about them. Contact a health care provider if you have a headache that is different from the usual headache, or if your symptoms are not helped by medicine. Get help right away if your headache becomes severe, you vomit, you have a loss of vision, you lose your balance, or you have a seizure. This information is not intended to replace advice given to you by your health care provider. Make sure you discuss any questions you have with your health care provider. Document Revised: 04/26/2021 Document Reviewed: 04/26/2021 Elsevier Patient Education  2023 [employer].

## 2022-09-06 ENCOUNTER — Encounter: Payer: Self-pay | Admitting: Family Medicine

## 2022-09-07 NOTE — Telephone Encounter (Signed)
Called provider line no pa is req. Ref# [id number] nickic  Pt is aware [facility]

## 2022-09-10 ENCOUNTER — Encounter: Payer: Self-pay | Admitting: Family Medicine

## 2022-09-11 ENCOUNTER — Ambulatory Visit (INDEPENDENT_AMBULATORY_CARE_PROVIDER_SITE_OTHER): Payer: Commercial Managed Care - PPO | Admitting: Family

## 2022-09-11 ENCOUNTER — Encounter: Payer: Self-pay | Admitting: Family Medicine

## 2022-09-11 ENCOUNTER — Telehealth: Payer: Self-pay | Admitting: Lab

## 2022-09-11 ENCOUNTER — Encounter: Payer: Self-pay | Admitting: Family

## 2022-09-11 VITALS — BP 150/90 | HR 77 | Temp 98.2°F | Ht 60.0 in | Wt 145.4 lb

## 2022-09-11 DIAGNOSIS — F411 Generalized anxiety disorder: Secondary | ICD-10-CM | POA: Diagnosis not present

## 2022-09-11 DIAGNOSIS — R030 Elevated blood-pressure reading, without diagnosis of hypertension: Secondary | ICD-10-CM

## 2022-09-11 LAB — HM DIABETES EYE EXAM

## 2022-09-11 MED ORDER — METOPROLOL SUCCINATE ER 50 MG PO TB24: 50.0000 mg | ORAL_TABLET | Freq: Every day | ORAL | 3 refills | Status: DC

## 2022-09-11 NOTE — Telephone Encounter (Signed)
On my chart review, there is the surgical history in [facility] for the left femur biopsy in 2005, but left breast biopsy appears to have been entered in Care Everywhere - see summary below. She may want to check with GYN to see if they may have it in their records and then visible through [facility]. I do not see a way to remove on my end but let me know if we need to look into this further.   Message sent to patient.   Ambulatory SummaryReceived 09/11/2022 [place] [facility] Procedures Surgical History Date Name Laterality Status  08/22/2022 Date of Last Mammogram  completed  06/20/2021 Date of Last Pap Smear  completed  04/13/2020 IUD Removal Procedure Note [facility])  completed   procedure on lower leg  completed   Breast Biopsy

## 2022-09-11 NOTE — Telephone Encounter (Signed)
Patient came in to the office to be seen for her BP

## 2022-09-11 NOTE — Telephone Encounter (Signed)
Pt was in office today, states that on her my chart is a breast biopsy pt notes that she has never has this preformed ..the patient would like this removed .Marland KitchenMarland Kitchenplease advise

## 2022-09-11 NOTE — Progress Notes (Signed)
Acute Office Visit  Subjective:     Patient ID: Ashley Gardner, female    DOB: Sep 16, 1981, 41 y.o.   MRN: [id number]  Chief Complaint  Patient presents with  . Hypertension    High BP, pt states she was having a headache and fatigue but nothing out of the ordinary     HPI Patient is in today with concerns of elevated blood pressure. She reports having it taken at work yesterday and it was in the 180s. At her last office visit, her blood pressure was elevated as well. She is on Toprol XL 25 mg daily and tolerating it well. She reports her parents have high blood pressure as well.   Past Medical History:  Diagnosis Date  . Anxiety    Decreased concentration, Excessive worry, insomnia, irritability, nervous/anxious behavior,obessions(food) restlessness.  . Depression    Feelings of hopelessness, feeling of worthlessness  . Hyperlipidemia   . Hypersomnia   . Palpitations   . Restlessness     Social History   Socioeconomic History  . Marital status: Divorced    Spouse name: Not on file  . Number of children: Not on file  . Years of education: Not on file  . Highest education level: Not on file  Occupational History  . Not on file  Tobacco Use  . Smoking status: Never  . Smokeless tobacco: Never  Vaping Use  . Vaping Use: Never used  Substance and Sexual Activity  . Alcohol use: Yes    Comment: occ  . Drug use: No  . Sexual activity: Yes    Birth control/protection: Pill  Other Topics Concern  . Not on file  Social History Narrative  . Not on file   Social Determinants of Health   Financial Resource Strain: Not on file  Food Insecurity: Not on file  Transportation Needs: Not on file  Physical Activity: Not on file  Stress: Not on file  Social Connections: Not on file  Intimate Partner Violence: Not on file    Past Surgical History:  Procedure Laterality Date  . LEFT MASS BIOPSY     Benign appearing fragments of  bone and fibrous tissue, deferential       Family History  Problem Relation Age of Onset  . Bipolar disorder Mother   . COPD Mother   . Depression Mother   . Diabetes Mother   . Heart attack Mother   . Hypertension Mother   . Heart attack Father   . Hyperlipidemia Father   . Hypertension Father   . Stroke Father     Allergies  Allergen Reactions  . Penicillins Hives    Current Outpatient Medications on File Prior to Visit  Medication Sig Dispense Refill  . buPROPion (WELLBUTRIN SR) 200 MG 12 hr tablet TAKE 1 TABLET (200 MG TOTAL) BY MOUTH 2 (TWO) TIMES DAILY. TAKE 1 TABLET BY MOUTH TWICE A DAY 180 tablet 1  . busPIRone (BUSPAR) 15 MG tablet TAKE 0.5 TABLETS (7.5 MG TOTAL) BY MOUTH 3 (THREE) TIMES DAILY. 45 tablet 5  . Levonorgest-Eth Estrad 91-Day (SEASONIQUE PO) Take 1 Dose by mouth 1 day or 1 dose.    . methylphenidate (RITALIN) 20 MG tablet Take 1 tablet (20 mg total) by mouth 2 (two) times daily with breakfast and lunch. 60 tablet 0  . methylphenidate (RITALIN) 20 MG tablet Take 1 tablet (20 mg total) by mouth 2 (two) times daily with breakfast and lunch. 60 tablet 0  . norethindrone-ethinyl estradiol-FE (LOESTRIN  FE) 1-20 MG-MCG tablet Take 1 tablet by mouth daily. (Patient not taking: Reported on 09/11/2022)    . omeprazole (PRILOSEC) 20 MG capsule Take 1 capsule (20 mg total) by mouth daily. (Patient not taking: Reported on 09/11/2022) 30 capsule 1   No current facility-administered medications on file prior to visit.    BP (!) 150/90   Pulse 77   Temp 98.2 F (36.8 C)   Ht 5' (1.524 m)   Wt 145 lb 6.4 oz (66 kg)   SpO2 99%   BMI 28.40 kg/m chart     Objective:    BP (!) 150/90   Pulse 77   Temp 98.2 F (36.8 C)   Ht 5' (1.524 m)   Wt 145 lb 6.4 oz (66 kg)   SpO2 99%   BMI 28.40 kg/m    Physical Exam Vitals and nursing note reviewed.  Constitutional:      Appearance: Normal appearance.  Cardiovascular:     Rate and Rhythm: Normal rate and regular rhythm.     Pulses: Normal pulses.      Heart sounds: Normal heart sounds.     Comments: 160/94 blood pressure recheck Pulmonary:     Effort: Pulmonary effort is normal.     Breath sounds: Normal breath sounds.  Abdominal:     General: Abdomen is flat.     Palpations: Abdomen is soft.  Musculoskeletal:        General: Normal range of motion.     Cervical back: Normal range of motion and neck supple.  Skin:    General: Skin is warm and dry.  Neurological:     General: No focal deficit present.     Mental Status: She is alert and oriented to person, place, and time.   Results for orders placed or performed in visit on 09/11/22  HM DIABETES EYE EXAM  Result Value Ref Range   HM Diabetic Eye Exam Retinopathy (A) No Retinopathy        Assessment & Plan:   Problem List Items Addressed This Visit     Generalized anxiety disorder   Elevated blood pressure reading - Primary    Meds ordered this encounter  Medications  . metoprolol succinate (TOPROL-XL) 50 MG 24 hr tablet    Sig: Take 1 tablet (50 mg total) by mouth daily. Take with or immediately following a meal.    Dispense:  30 tablet    Refill:  3   Call the office if symptoms worsen or persist. Follow-up at currently scheduled visit, 10/16. Low sodium diet. Exercise 45 min at least 4 days per week.  No follow-ups on file.  Eulis Foster, FNP

## 2022-09-16 ENCOUNTER — Encounter: Payer: Self-pay | Admitting: Family Medicine

## 2022-09-16 ENCOUNTER — Other Ambulatory Visit: Payer: Self-pay

## 2022-09-16 ENCOUNTER — Emergency Department (HOSPITAL_BASED_OUTPATIENT_CLINIC_OR_DEPARTMENT_OTHER)
Admission: EM | Admit: 2022-09-16 | Discharge: 2022-09-16 | Disposition: A | Discharge status: Home / Self Care | Payer: Commercial Managed Care - PPO | Attending: Student | Admitting: Student

## 2022-09-16 ENCOUNTER — Emergency Department (HOSPITAL_BASED_OUTPATIENT_CLINIC_OR_DEPARTMENT_OTHER): Payer: Commercial Managed Care - PPO

## 2022-09-16 DIAGNOSIS — Z79899 Other long term (current) drug therapy: Secondary | ICD-10-CM | POA: Insufficient documentation

## 2022-09-16 DIAGNOSIS — I10 Essential (primary) hypertension: Secondary | ICD-10-CM

## 2022-09-16 DIAGNOSIS — R030 Elevated blood-pressure reading, without diagnosis of hypertension: Secondary | ICD-10-CM

## 2022-09-16 DIAGNOSIS — R519 Headache, unspecified: Secondary | ICD-10-CM | POA: Diagnosis present

## 2022-09-16 LAB — COMPREHENSIVE METABOLIC PANEL
ALT: 14 U/L (ref 0–44)
AST: 11 U/L — ABNORMAL LOW (ref 15–41)
Albumin: 4.5 g/dL (ref 3.5–5.0)
Alkaline Phosphatase: 40 U/L (ref 38–126)
Anion gap: 9 (ref 5–15)
BUN: 8 mg/dL (ref 6–20)
CO2: 26 mmol/L (ref 22–32)
Calcium: 9.7 mg/dL (ref 8.9–10.3)
Chloride: 104 mmol/L (ref 98–111)
Creatinine, Ser: 1.02 mg/dL — ABNORMAL HIGH (ref 0.44–1.00)
GFR, Estimated: >60 mL/min (ref >60)
Glucose, Bld: 88 mg/dL (ref 70–99)
Potassium: 4.2 mmol/L (ref 3.5–5.1)
Sodium: 139 mmol/L (ref 135–145)
Total Bilirubin: 0.4 mg/dL (ref 0.3–1.2)
Total Protein: 7 g/dL (ref 6.5–8.1)

## 2022-09-16 LAB — CBC WITH DIFFERENTIAL/PLATELET
Abs Immature Granulocytes: 0.02 10*3/uL (ref 0.00–0.07)
Basophils Absolute: 0 10*3/uL (ref 0.0–0.1)
Basophils Relative: 1 %
Eosinophils Absolute: 0 10*3/uL (ref 0.0–0.5)
Eosinophils Relative: 1 %
HCT: 42.8 % (ref 36.0–46.0)
Hemoglobin: 14.5 g/dL (ref 12.0–15.0)
Immature Granulocytes: 0 %
Lymphocytes Relative: 20 %
Lymphs Abs: 1.7 10*3/uL (ref 0.7–4.0)
MCH: 30 pg (ref 26.0–34.0)
MCHC: 33.9 g/dL (ref 30.0–36.0)
MCV: 88.6 fL (ref 80.0–100.0)
Monocytes Absolute: 0.6 10*3/uL (ref 0.1–1.0)
Monocytes Relative: 7 %
Neutro Abs: 6.1 10*3/uL (ref 1.7–7.7)
Neutrophils Relative %: 71 %
Platelets: 208 10*3/uL (ref 150–400)
RBC: 4.83 MIL/uL (ref 3.87–5.11)
RDW: 12.7 % (ref 11.5–15.5)
WBC: 8.5 10*3/uL (ref 4.0–10.5)
nRBC: 0 % (ref 0.0–0.2)

## 2022-09-16 LAB — TROPONIN I (HIGH SENSITIVITY): Troponin I (High Sensitivity): <2 ng/L (ref <18)

## 2022-09-16 LAB — URINALYSIS, ROUTINE W REFLEX MICROSCOPIC
Bilirubin Urine: NEGATIVE
Glucose, UA: NEGATIVE mg/dL
Hgb urine dipstick: NEGATIVE
Ketones, ur: NEGATIVE mg/dL
Leukocytes,Ua: NEGATIVE
Nitrite: NEGATIVE
Protein, ur: NEGATIVE mg/dL
Specific Gravity, Urine: <1.005 — ABNORMAL LOW (ref 1.005–1.030)
pH: 7 (ref 5.0–8.0)

## 2022-09-16 LAB — PREGNANCY, URINE: Preg Test, Ur: NEGATIVE

## 2022-09-16 MED ORDER — LACTATED RINGERS IV BOLUS
1000.0000 mL | Freq: Once | INTRAVENOUS | Status: AC
  Administered 2022-09-16: 1000 mL via INTRAVENOUS

## 2022-09-16 MED ORDER — IOHEXOL 350 MG/ML SOLN
100.0000 mL | Freq: Once | INTRAVENOUS | Status: AC | PRN
  Administered 2022-09-16: 75 mL via INTRAVENOUS

## 2022-09-16 MED ORDER — PROCHLORPERAZINE EDISYLATE 10 MG/2ML IJ SOLN
10.0000 mg | Freq: Once | INTRAMUSCULAR | Status: AC
  Administered 2022-09-16: 10 mg via INTRAVENOUS
  Filled 2022-09-16: qty 2

## 2022-09-16 MED ORDER — DIPHENHYDRAMINE HCL 50 MG/ML IJ SOLN
25.0000 mg | Freq: Once | INTRAMUSCULAR | Status: AC
  Administered 2022-09-16: 25 mg via INTRAVENOUS
  Filled 2022-09-16: qty 1

## 2022-09-16 NOTE — ED Notes (Signed)
Patient given discharge instructions. Questions were answered. Patient verbalized understanding of discharge instructions and care at home.  Discharged with family 

## 2022-09-16 NOTE — ED Provider Notes (Signed)
[facility] EMERGENCY DEPT Provider Note  CSN: [id number] Arrival date & time: 09/16/22 1253  Chief Complaint(s) Hypertension  HPI Ashley Gardner is a 41 y.o. female with PMH anxiety, depression, HTN, HLD who presents emergency department for evaluation of headache and elevated blood pressures.  Patient states that she feels like she has had a headache for 1 month and she is currently scheduled for CT angiogram of the brain due to family history of AVMs.  She states that over the last 1 week she has had persistently elevated blood pressures with systolics greater than 314 and her primary care physician increased her beta-blocker but she is still having persistent elevated blood pressures.  Patient continues to endorse headache but denies chest pain, shortness of breath, abdominal pain, nausea, vomiting or other systemic symptoms.   Past Medical History Past Medical History:  Diagnosis Date   Anxiety    Decreased concentration, Excessive worry, insomnia, irritability, nervous/anxious behavior,obessions(food) restlessness.   Depression    Feelings of hopelessness, feeling of worthlessness   Hyperlipidemia    Hypersomnia    Palpitations    Restlessness    Patient Active Problem List   Diagnosis Date Noted   ADD (attention deficit disorder) 09/2020   Family history of early CAD 09/30/2017   Elevated blood pressure reading 08/06/2017   Generalized anxiety disorder 08/06/2017   Insomnia 08/06/2017   Current severe episode of major depressive disorder without psychotic features without prior episode ([place]) 08/06/2017   Home Medication(s) Prior to Admission medications   Medication Sig Start Date End Date Taking? Authorizing Provider  buPROPion (WELLBUTRIN SR) 200 MG 12 hr tablet TAKE 1 TABLET (200 MG TOTAL) BY MOUTH 2 (TWO) TIMES DAILY. TAKE 1 TABLET BY MOUTH TWICE A DAY 04/23/22   [name], MD  busPIRone (BUSPAR) 15 MG tablet TAKE 0.5 TABLETS (7.5 MG TOTAL) BY  MOUTH 3 (THREE) TIMES DAILY. 08/28/22   [name], MD  Levonorgest-Eth Radene Journey 91-Day (SEASONIQUE PO) Take 1 Dose by mouth 1 day or 1 dose.    [provider]  methylphenidate (RITALIN) 20 MG tablet Take 1 tablet (20 mg total) by mouth 2 (two) times daily with breakfast and lunch. 05/02/22   [name], MD  methylphenidate (RITALIN) 20 MG tablet Take 1 tablet (20 mg total) by mouth 2 (two) times daily with breakfast and lunch. 08/18/22   [name], MD  metoprolol succinate (TOPROL-XL) 50 MG 24 hr tablet Take 1 tablet (50 mg total) by mouth daily. Take with or immediately following a meal. 09/11/22   Kennyth Arnold, FNP                                                                                                                                    Past Surgical History Past Surgical History:  Procedure Laterality Date   LEFT MASS BIOPSY     Benign appearing fragments of  bone and fibrous tissue, deferential     Family History Family History  Problem Relation Age of Onset   Bipolar disorder Mother    COPD Mother    Depression Mother    Diabetes Mother    Heart attack Mother    Hypertension Mother    Heart attack Father    Hyperlipidemia Father    Hypertension Father    Stroke Father     Social History Social History   Tobacco Use   Smoking status: Never   Smokeless tobacco: Never  Vaping Use   Vaping Use: Never used  Substance Use Topics   Alcohol use: Yes    Comment: occ   Drug use: No   Allergies Penicillins  Review of Systems Review of Systems  Neurological:  Positive for headaches.    Physical Exam Vital Signs  I have reviewed the triage vital signs BP (!) 182/103   Pulse 84   Temp 99.6 F (37.6 C) (Oral)   Resp (!) 21   Ht 5' (1.524 m)   Wt 64.9 kg   LMP 09/09/2022   SpO2 99%   BMI 27.93 kg/m   Physical Exam Vitals and nursing note reviewed.  Constitutional:      General: She is not in acute distress.    Appearance: She  is well-developed.  HENT:     Head: Normocephalic and atraumatic.  Eyes:     Conjunctiva/sclera: Conjunctivae normal.  Cardiovascular:     Rate and Rhythm: Normal rate and regular rhythm.     Heart sounds: No murmur heard. Pulmonary:     Effort: Pulmonary effort is normal. No respiratory distress.     Breath sounds: Normal breath sounds.  Abdominal:     Palpations: Abdomen is soft.     Tenderness: There is no abdominal tenderness.  Musculoskeletal:        General: No swelling.     Cervical back: Neck supple.  Skin:    General: Skin is warm and dry.     Capillary Refill: Capillary refill takes less than 2 seconds.  Neurological:     Mental Status: She is alert.  Psychiatric:        Mood and Affect: Mood normal.     ED Results and Treatments Labs (all labs ordered are listed, but only abnormal results are displayed) Labs Reviewed - No data to display                                                                                                                        Radiology No results found.  Pertinent labs & imaging results that were available during my care of the patient were reviewed by me and considered in my medical decision making (see MDM for details).  Medications Ordered in ED Medications - No data to display  Procedures Procedures  (including critical care time)  Medical Decision Making / ED Course   This patient presents to the ED for concern of headache, high blood pressure, this involves an extensive number of treatment options, and is a complaint that carries with it a high risk of complications and morbidity.  The differential diagnosis includes tension headache, migraine headache, AVM, dural venous sinus thrombosis, hypertensive urgency, hypertensive emergency  MDM: Patient seen in the emergency room for evaluation of  headache and high blood pressure.  Physical exam unremarkable.  Laboratory evaluation unremarkable with no evidence of endorgan damage.  Headache cocktail given and on reevaluation her headache appears to be improving significantly.  At time of signout, patient pending CT angio head and CT venogram.  Please see provider signout for continuation of work-up.   Additional history obtained: -Additional history obtained from husband -External records from outside source obtained and reviewed including: Chart review including previous notes, labs, imaging, consultation notes   Lab Tests: -I ordered, reviewed, and interpreted labs.   The pertinent results include:   Labs Reviewed - No data to display    Imaging Studies ordered: I ordered imaging studies including CT angio head, CTV And the studies are pending   Medicines ordered and prescription drug management: No orders of the defined types were placed in this encounter.   -I have reviewed the patients home medicines and have made adjustments as needed  Critical interventions none   Cardiac Monitoring: The patient was maintained on a cardiac monitor.  I personally viewed and interpreted the cardiac monitored which showed an underlying rhythm of: NSR  Social Determinants of Health:  Factors impacting patients care include: none   Reevaluation: After the interventions noted above, I reevaluated the patient and found that they have :improved  Co morbidities that complicate the patient evaluation  Past Medical History:  Diagnosis Date   Anxiety    Decreased concentration, Excessive worry, insomnia, irritability, nervous/anxious behavior,obessions(food) restlessness.   Depression    Feelings of hopelessness, feeling of worthlessness   Hyperlipidemia    Hypersomnia    Palpitations    Restlessness       Dispostion: I considered admission for this patient, and disposition pending imaging. Please see provider signout note for  continuation of work up     Final Clinical Impression(s) / ED Diagnoses Final diagnoses:  None     @PCDICTATION @    Buffy Ehler, , MD 09/16/22 2006

## 2022-09-16 NOTE — Discharge Instructions (Signed)
You are seen emergency room today with headache and elevated blood pressure.  Your CT scans were reassuring.  Please call your primary care physician to discuss your blood pressure and any medication adjustments they may want to make.  Please return with any new or suddenly worsening symptoms.

## 2022-09-16 NOTE — ED Notes (Signed)
Pt aware of need for Urine. Unable to currently.

## 2022-09-16 NOTE — ED Triage Notes (Signed)
Patient arrives with complaints of increasing HTN for over 1 week. Patient has been seen by her PCP who increased her bp medication dosing. However, the patient is concerned due to her diastolic being the 427'C.

## 2022-09-16 NOTE — ED Provider Notes (Signed)
Blood pressure (!) 175/110, pulse 82, temperature 99.6 F (37.6 C), temperature source Oral, resp. rate 13, height 5' (1.524 m), weight 64.9 kg, last menstrual period 09/09/2022, SpO2 99 %, currently breastfeeding.  Assuming care from Dr. Vella Kohler.  In short, Ashley Gardner is a 41 y.o. female with a chief complaint of Hypertension .  Refer to the original H&P for additional details.  The current plan of care is to follow labs and CTA/CTV.  04:45 PM CTA without significant stenosis or aneurysm identified.  CT venogram similarly unremarkable.  Headache better controlled here and blood pressure downtrending nicely.  Plan will be for patient to follow closely with her primary care physician for hypertension follow-up and blood pressure medication adjustment as needed.  Do not see evidence of acute hypertensive emergency to require hospitalization or additional testing.  Patient is comfortable with this plan.  Discussed tricked ED return precaution.   Margette Fast, MD 09/16/22 1655

## 2022-09-17 MED ORDER — AMLODIPINE BESYLATE 5 MG PO TABS: 5.0000 mg | ORAL_TABLET | Freq: Every day | ORAL | 1 refills | Status: DC

## 2022-09-17 NOTE — Telephone Encounter (Signed)
ER note and labs/studies reviewed.  Reassuring CTA of head.  Creatinine 1.02, but similar to 1.00 in 2012.  Toprol-XL was increased from 25 mg daily to 50 mg daily on 09/11/2022.  Called patient. Concerned to check BP as 179/129 yesterday prior to ER.  In ER, 182/103, 177/110.  Still on toprol 50mg  qd. Headache better after leaving and this morning. Slight R headache - typical HA, not severe.  Will start amlodipine 5mg  in addition to her toprol. Keep a record blood pressures outside of the office and update in next few days. Keep appt as planned on the 16th. All questions answered.

## 2022-09-18 ENCOUNTER — Other Ambulatory Visit: Payer: Commercial Managed Care - PPO

## 2022-09-20 ENCOUNTER — Telehealth: Payer: Self-pay

## 2022-09-20 NOTE — Telephone Encounter (Signed)
Patient returned my call and let me know that her BP was coming down and she feels ok to wait until the 16th to be seen

## 2022-09-20 NOTE — Telephone Encounter (Signed)
Called patient to see if we can get her in before Monday for her BP recheck.

## 2022-09-24 ENCOUNTER — Ambulatory Visit (INDEPENDENT_AMBULATORY_CARE_PROVIDER_SITE_OTHER): Payer: Commercial Managed Care - PPO | Admitting: Family Medicine

## 2022-09-24 ENCOUNTER — Encounter: Payer: Self-pay | Admitting: Family Medicine

## 2022-09-24 VITALS — BP 138/88 | HR 76 | Temp 98.2°F | Ht 60.0 in | Wt 146.5 lb

## 2022-09-24 DIAGNOSIS — I10 Essential (primary) hypertension: Secondary | ICD-10-CM | POA: Diagnosis not present

## 2022-09-24 DIAGNOSIS — R55 Syncope and collapse: Secondary | ICD-10-CM

## 2022-09-24 DIAGNOSIS — R519 Headache, unspecified: Secondary | ICD-10-CM

## 2022-09-24 DIAGNOSIS — F411 Generalized anxiety disorder: Secondary | ICD-10-CM

## 2022-09-24 LAB — CBC
HCT: 43.5 % (ref 36.0–46.0)
Hemoglobin: 14.6 g/dL (ref 12.0–15.0)
MCHC: 33.7 g/dL (ref 30.0–36.0)
MCV: 89.1 fl (ref 78.0–100.0)
Platelets: 218 10*3/uL (ref 150.0–400.0)
RBC: 4.88 Mil/uL (ref 3.87–5.11)
RDW: 13.5 % (ref 11.5–15.5)
WBC: 8.7 10*3/uL (ref 4.0–10.5)

## 2022-09-24 LAB — BASIC METABOLIC PANEL
BUN: 10 mg/dL (ref 6–23)
CO2: 25 mEq/L (ref 19–32)
Calcium: 9.7 mg/dL (ref 8.4–10.5)
Chloride: 103 mEq/L (ref 96–112)
Creatinine, Ser: 0.8 mg/dL (ref 0.40–1.20)
GFR: 91.17 mL/min (ref >60.00)
Glucose, Bld: 96 mg/dL (ref 70–99)
Potassium: 4.6 mEq/L (ref 3.5–5.1)
Sodium: 137 mEq/L (ref 135–145)

## 2022-09-24 LAB — TSH: TSH: 1.58 u[IU]/mL (ref 0.35–5.50)

## 2022-09-24 MED ORDER — BUSPIRONE HCL 10 MG PO TABS: 10.0000 mg | ORAL_TABLET | Freq: Three times a day (TID) | ORAL | 1 refills | Status: DC

## 2022-09-24 NOTE — Telephone Encounter (Signed)
Patient was seen today for a formal visit

## 2022-09-24 NOTE — Patient Instructions (Addendum)
You are currently taking 15mg  buspar - 1/3 pill 3 times per day (5mg  per dose). I am increasing your dose to 10mg  3 times per day full pill). If that dose is too strong, decrease to 1/2 pill (5mg ) 3 times per day.  No change in blood pressure meds for now. I will check other labs and studies and refer you to hypertension clinic, but follow up with me in 1 week for blood pressure. Keep appointment with neurology to discuss headache on the 23rd.  Stop ritalin for now until blood pressure remains more stable.  If blood pressure spikes over 145 on top reading you can take additional 25mg  of metoprolol.   Return to the clinic or go to the nearest emergency room if any of your symptoms worsen or new symptoms occur.  Managing Your Hypertension Hypertension, also called high blood pressure, is when the force of the blood pressing against the walls of the arteries is too strong. Arteries are blood vessels that carry blood from your heart throughout your body. Hypertension forces the heart to work harder to pump blood and may cause the arteries to become narrow or stiff. Understanding blood pressure readings A blood pressure reading includes a higher number over a lower number: The first, or top, number is called the systolic pressure. It is a measure of the pressure in your arteries as your heart beats. The second, or bottom number, is called the diastolic pressure. It is a measure of the pressure in your arteries as the heart relaxes. For most people, a normal blood pressure is below 120/80. Your personal target blood pressure may vary depending on your medical conditions, your age, and other factors. Blood pressure is classified into four stages. Based on your blood pressure reading, your health care provider may use the following stages to determine what type of treatment you need, if any. Systolic pressure and diastolic pressure are measured in a unit called millimeters of mercury (mmHg). Normal Systolic  pressure: below 120. Diastolic pressure: below 80. Elevated Systolic pressure: 120-129. Diastolic pressure: below 80. Hypertension stage 1 Systolic pressure: 130-139. Diastolic pressure: 80-89. Hypertension stage 2 Systolic pressure: 140 or above. Diastolic pressure: 90 or above. How can this condition affect me? Managing your hypertension is very important. Over time, hypertension can damage the arteries and decrease blood flow to parts of the body, including the brain, heart, and kidneys. Having untreated or uncontrolled hypertension can lead to: A heart attack. A stroke. A weakened blood vessel (aneurysm). Heart failure. Kidney damage. Eye damage. Memory and concentration problems. Vascular dementia. What actions can I take to manage this condition? Hypertension can be managed by making lifestyle changes and possibly by taking medicines. Your health care provider will help you make a plan to bring your blood pressure within a normal range. You may be referred for counseling on a healthy diet and physical activity. Nutrition  Eat a diet that is high in fiber and potassium, and low in salt (sodium), added sugar, and fat. An example eating plan is called the DASH diet. DASH stands for Dietary Approaches to Stop Hypertension. To eat this way: Eat plenty of fresh fruits and vegetables. Try to fill one-half of your plate at each meal with fruits and vegetables. Eat whole grains, such as whole-wheat pasta, brown rice, or whole-grain bread. Fill about one-fourth of your plate with whole grains. Eat low-fat dairy products. Avoid fatty cuts of meat, processed or cured meats, and poultry with skin. Fill about one-fourth of your  plate with lean proteins such as fish, chicken without skin, beans, eggs, and tofu. Avoid pre-made and processed foods. These tend to be higher in sodium, added sugar, and fat. Reduce your daily sodium intake. Many people with hypertension should eat less than 1,500 mg  of sodium a day. Lifestyle  Work with your health care provider to maintain a healthy body weight or to lose weight. Ask what an ideal weight is for you. Get at least 30 minutes of exercise that causes your heart to beat faster (aerobic exercise) most days of the week. Activities may include walking, swimming, or biking. Include exercise to strengthen your muscles (resistance exercise), such as weight lifting, as part of your weekly exercise routine. Try to do these types of exercises for 30 minutes at least 3 days a week. Do not use any products that contain nicotine or tobacco. These products include cigarettes, chewing tobacco, and vaping devices, such as e-cigarettes. If you need help quitting, ask your health care provider. Control any long-term (chronic) conditions you have, such as high cholesterol or diabetes. Identify your sources of stress and find ways to manage stress. This may include meditation, deep breathing, or making time for fun activities. Alcohol use Do not drink alcohol if: Your health care provider tells you not to drink. You are pregnant, may be pregnant, or are planning to become pregnant. If you drink alcohol: Limit how much you have to: 0-1 drink a day for women. 0-2 drinks a day for men. Know how much alcohol is in your drink. In the [place] one drink equals one 12 oz bottle of beer (355 mL), one 5 oz glass of wine (148 mL), or one 1 oz glass of hard liquor (44 mL). Medicines Your health care provider may prescribe medicine if lifestyle changes are not enough to get your blood pressure under control and if: Your systolic blood pressure is 130 or higher. Your diastolic blood pressure is 80 or higher. Take medicines only as told by your health care provider. Follow the directions carefully. Blood pressure medicines must be taken as told by your health care provider. The medicine does not work as well when you skip doses. Skipping doses also puts you at risk for  problems. Monitoring Before you monitor your blood pressure: Do not smoke, drink caffeinated beverages, or exercise within 30 minutes before taking a measurement. Use the bathroom and empty your bladder (urinate). Sit quietly for at least 5 minutes before taking measurements. Monitor your blood pressure at home as told by your health care provider. To do this: Sit with your back straight and supported. Place your feet flat on the floor. Do not cross your legs. Support your arm on a flat surface, such as a table. Make sure your upper arm is at heart level. Each time you measure, take two or three readings one minute apart and record the results. You may also need to have your blood pressure checked regularly by your health care provider. General information Talk with your health care provider about your diet, exercise habits, and other lifestyle factors that may be contributing to hypertension. Review all the medicines you take with your health care provider because there may be side effects or interactions. Keep all follow-up visits. Your health care provider can help you create and adjust your plan for managing your high blood pressure. Where to find more information National Heart, Lung, and Blood Institute: https://wilson-eaton.com/ American Heart Association: www.heart.org Contact a health care provider if: You think you  are having a reaction to medicines you have taken. You have repeated (recurrent) headaches. You feel dizzy. You have swelling in your ankles. You have trouble with your vision. Get help right away if: You develop a severe headache or confusion. You have unusual weakness or numbness, or you feel faint. You have severe pain in your chest or abdomen. You vomit repeatedly. You have trouble breathing. These symptoms may be an emergency. Get help right away. Call 911. Do not wait to see if the symptoms will go away. Do not drive yourself to the hospital. Summary Hypertension  is when the force of blood pumping through your arteries is too strong. If this condition is not controlled, it may put you at risk for serious complications. Your personal target blood pressure may vary depending on your medical conditions, your age, and other factors. For most people, a normal blood pressure is less than 120/80. Hypertension is managed by lifestyle changes, medicines, or both. Lifestyle changes to help manage hypertension include losing weight, eating a healthy, low-sodium diet, exercising more, stopping smoking, and limiting alcohol. This information is not intended to replace advice given to you by your health care provider. Make sure you discuss any questions you have with your health care provider. Document Revised: 08/10/2021 Document Reviewed: 08/10/2021 Elsevier Patient Education  2023 [employer].

## 2022-09-24 NOTE — Progress Notes (Signed)
Subjective:  Patient ID: Ashley Gardner, female    DOB: Feb 07, 1981  Age: 41 y.o. MRN: [id number]  CC:  Chief Complaint  Patient presents with   Hypertension    Pt reports feeling "normal" still has headache, BP seems to be up and down, concerned she was going to pass out at one point spoke with EMS and pt declined ER     HPI KEYUNDRA FANT presents for   Hypertension: See prior notes, recent elevated blood pressures.  Previously had been on Toprol XL for history of palpitations.  ER visit for headache, elevated blood pressure October 8.  Family history of arteriovenous malformation.  Initially ordered CT angiogram outpatient, this was performed at ER visit on October 8, without significant stenosis or aneurysm identified.  CT venogram was also unremarkable. See my chart messages starting October 8.  Blood pressure persistently elevated, start amlodipine 5 mg on October 9.  Also concern of her Wellbutrin contributing to high blood pressure.  Had been on 200 mg twice daily, we decreased that to once per day.   BP improved to 147/101 on October 11, then 150/94. MyChart message received from 2 days ago.  Felt like vision was going out, felt off, transient then recurred. Near syncopal symptoms at that time sitting in the car.  Preceding dizzy/lightheadedness.  No true syncope.  Felt shaky, but anxiety flared. No chest pain, no heart palpitations. Called 911, blood pressure 170/111 by EMS, then 170/97.  Blood sugar 91.  Did have a headache at that time but was feeling better.  Declined ER eval. Still some headache since Saturday- more today.  Episodic tingling on corner of mouth.  Trouble with finding words at times - various times - past month.  No focal weakness.  On daily dosing wellbutrin past 6 days.  108/87, HR 90 this am. 131/92 yesterday morning.  159/105 last night. HR 78.  Appt with neuro on 10/23 to discuss HA.  Creatinine 1.02 on 09/16/22. 0.76, 0.92, 0.86, 1.00 since 2011. Normal  CBC on 10/8.  Urine negative for protein on 09/16/22 ER visit.  Has remained on ritalin 20mg  BID for ADD.  No energy drinks, single diet coke per day.   Home readings as above.  BP Readings from Last 3 Encounters:  09/24/22 138/88  09/16/22 (!) 167/102  09/11/22 (!) 150/90   Lab Results  Component Value Date   CREATININE 1.02 (H) 09/16/2022   Anxiety: Wellbutrin decreased d/t concern of elevated BP. Worsening anxiety on lower dose. Worried about symptoms and dying. Wellbutrin helps to get to sleep. Taking buspar 5mg  tid  History Patient Active Problem List   Diagnosis Date Noted   ADD (attention deficit disorder) 09/2020   Family history of early CAD 09/30/2017   Elevated blood pressure reading 08/06/2017   Generalized anxiety disorder 08/06/2017   Insomnia 08/06/2017   Current severe episode of major depressive disorder without psychotic features without prior episode ([place]) 08/06/2017   Past Medical History:  Diagnosis Date   Anxiety    Decreased concentration, Excessive worry, insomnia, irritability, nervous/anxious behavior,obessions(food) restlessness.   Depression    Feelings of hopelessness, feeling of worthlessness   Hyperlipidemia    Hypersomnia    Palpitations    Restlessness    Past Surgical History:  Procedure Laterality Date   LEFT MASS BIOPSY     Benign appearing fragments of  bone and fibrous tissue, deferential     Allergies  Allergen Reactions   Penicillins Hives  Prior to Admission medications   Medication Sig Start Date End Date Taking? Authorizing Provider  amLODipine (NORVASC) 5 MG tablet Take 1 tablet (5 mg total) by mouth daily. 09/17/22  Yes Shade Flood, MD  buPROPion (WELLBUTRIN SR) 200 MG 12 hr tablet TAKE 1 TABLET (200 MG TOTAL) BY MOUTH 2 (TWO) TIMES DAILY. TAKE 1 TABLET BY MOUTH TWICE A DAY 04/23/22  Yes Shade Flood, MD  busPIRone (BUSPAR) 15 MG tablet TAKE 0.5 TABLETS (7.5 MG TOTAL) BY MOUTH 3 (THREE) TIMES DAILY. 08/28/22  Yes  Shade Flood, MD  Levonorgest-Eth [place] 91-Day (SEASONIQUE PO) Take 1 Dose by mouth 1 day or 1 dose.   Yes [provider]  methylphenidate (RITALIN) 20 MG tablet Take 1 tablet (20 mg total) by mouth 2 (two) times daily with breakfast and lunch. 05/02/22  Yes Shade Flood, MD  methylphenidate (RITALIN) 20 MG tablet Take 1 tablet (20 mg total) by mouth 2 (two) times daily with breakfast and lunch. 08/18/22  Yes Shade Flood, MD  metoprolol succinate (TOPROL-XL) 50 MG 24 hr tablet Take 1 tablet (50 mg total) by mouth daily. Take with or immediately following a meal. 09/11/22  Yes Eulis Foster, FNP   Social History   Socioeconomic History   Marital status: Divorced    Spouse name: Not on file   Number of children: Not on file   Years of education: Not on file   Highest education level: Not on file  Occupational History   Not on file  Tobacco Use   Smoking status: Never   Smokeless tobacco: Never  Vaping Use   Vaping Use: Never used  Substance and Sexual Activity   Alcohol use: Yes    Comment: occ   Drug use: No   Sexual activity: Yes    Birth control/protection: Pill  Other Topics Concern   Not on file  Social History Narrative   Not on file   Social Determinants of Health   Financial Resource Strain: Not on file  Food Insecurity: Not on file  Transportation Needs: Not on file  Physical Activity: Not on file  Stress: Not on file  Social Connections: Not on file  Intimate Partner Violence: Not on file    Review of Systems Per HPI.   Objective:   Vitals:   09/24/22 0846  BP: 138/88  Pulse: 76  Temp: 98.2 F (36.8 C)  TempSrc: Oral  SpO2: 97%  Weight: 146 lb 8 oz (66.5 kg)  Height: 5' (1.524 m)   Physical Exam Vitals reviewed.  Constitutional:      Appearance: Normal appearance. She is well-developed.  HENT:     Head: Normocephalic and atraumatic.  Eyes:     Conjunctiva/sclera: Conjunctivae normal.     Pupils: Pupils are equal,  round, and reactive to light.  Neck:     Vascular: No carotid bruit.  Cardiovascular:     Rate and Rhythm: Normal rate and regular rhythm.     Heart sounds: Normal heart sounds.  Pulmonary:     Effort: Pulmonary effort is normal.     Breath sounds: Normal breath sounds.  Abdominal:     Palpations: Abdomen is soft. There is no pulsatile mass.     Tenderness: There is no abdominal tenderness.     Comments: No abdominal bruit.   Musculoskeletal:     Right lower leg: No edema.     Left lower leg: No edema.  Skin:    General: Skin  is warm and dry.  Neurological:     Mental Status: She is alert and oriented to person, place, and time.  Psychiatric:        Mood and Affect: Mood normal.        Behavior: Behavior normal.    EKG, sinus rhythm.  PR interval 110.  Previously 112 on 09/30/2017.  No acute ST changes, T wave inverted in lead III, flat on prior EKG in 2018 no apparent acute findings.   51 minutes spent during visit, including chart review, counseling and assimilation of information, review of prior labs and ER imaging, discussion of new near syncope, exam, discussion of plan, and chart completion. Not including time of EKG review.    Assessment & Plan:  MIRZA KIDNEY is a 41 y.o. female . Hypertension, unspecified type - Plan: US Renal Artery Stenosis, Basic metabolic panel, TSH, EKG 12-Lead, Ambulatory referral to Advanced Hypertension [facility] - [facility], Metanephrines, Urine, 24 hour  Nonintractable episodic headache, unspecified headache type  Generalized anxiety disorder - Plan: busPIRone (BUSPAR) 10 MG tablet  Near syncope - Plan: CBC  -Improving blood pressure based on home readings with recent near syncopal episode.  Blood pressure this morning as well as in office look much better.  We will continue same dose of amlodipine for now, option of additional 25 mg metoprolol for elevated home readings.  -Given degree of elevated blood pressure as well as recent  elevations, consider secondary hypertension.  Labs reviewed from ER we will check TSH, BMP, urine metanephrines to rule out pheochromocytoma, especially with recent near syncopal episode although denies new palpitations/flushing.  Check renal artery ultrasound to rule out stenosis.  Refer to advanced hypertension clinic to evaluate for other secondary causes of hypertension.  -Borderline short PR interval.  No med changes for now, continue to monitor, ER precautions if palpitations  -As above, has follow-up with neurology/headache specialist in 1 week.  Previously CTA reassuring.  Increased anxiety as above, continue lower dose Wellbutrin for now given possible improvement in blood pressure with that change.  Option of higher dosing of BuSpar at 10 mg 3 times daily, can back down to 5 mg if intolerant to that change.  Hold on Ritalin for now given persistent elevated blood pressure as above.  Recheck 1 week.   ER precautions given.  No orders of the defined types were placed in this encounter.  Patient Instructions  You are currently taking 15mg  buspar - 1/3 pill 3 times per day (5mg  per dose). I am increasing your dose to 10mg  3 times per day full pill). If that dose is too strong, decrease to 1/2 pill (5mg ) 3 times per day.  No change in blood pressure meds for now. I will check other labs and studies and refer you to hypertension clinic, but follow up with me in 1 week for blood pressure. Keep appointment with neurology to discuss headache on the 23rd.  Stop ritalin for now until blood pressure remains more stable.   Return to the clinic or go to the nearest emergency room if any of your symptoms worsen or new symptoms occur.     Signed,   , MD [facility], [facility] 09/24/22 9:47 AM

## 2022-09-28 ENCOUNTER — Ambulatory Visit: Payer: Commercial Managed Care - PPO | Admitting: Family Medicine

## 2022-10-01 ENCOUNTER — Ambulatory Visit (INDEPENDENT_AMBULATORY_CARE_PROVIDER_SITE_OTHER): Payer: Commercial Managed Care - PPO | Admitting: Family Medicine

## 2022-10-01 ENCOUNTER — Encounter: Payer: Self-pay | Admitting: Family Medicine

## 2022-10-01 ENCOUNTER — Ambulatory Visit: Payer: Commercial Managed Care - PPO | Admitting: Family Medicine

## 2022-10-01 VITALS — BP 138/88 | HR 79 | Temp 98.5°F | Ht 60.0 in | Wt 149.0 lb

## 2022-10-01 DIAGNOSIS — I10 Essential (primary) hypertension: Secondary | ICD-10-CM | POA: Insufficient documentation

## 2022-10-01 DIAGNOSIS — F909 Attention-deficit hyperactivity disorder, unspecified type: Secondary | ICD-10-CM | POA: Insufficient documentation

## 2022-10-01 DIAGNOSIS — F418 Other specified anxiety disorders: Secondary | ICD-10-CM | POA: Diagnosis not present

## 2022-10-01 DIAGNOSIS — O24419 Gestational diabetes mellitus in pregnancy, unspecified control: Secondary | ICD-10-CM | POA: Insufficient documentation

## 2022-10-01 MED ORDER — BUPROPION HCL ER (SR) 200 MG PO TB12: 200.0000 mg | ORAL_TABLET | Freq: Two times a day (BID) | ORAL | 1 refills | Status: DC

## 2022-10-01 MED ORDER — AMLODIPINE BESYLATE 2.5 MG PO TABS: 2.5000 mg | ORAL_TABLET | Freq: Every day | ORAL | 1 refills | Status: DC

## 2022-10-01 NOTE — Patient Instructions (Signed)
Ok to restart wellbutrin twice per day but return to daily dosing if any increase in blood pressure.  Add additional 2.5mg  amlodipine - total dose 7.5mg .  Ok to return to prior dose Buspar.  Recheck in next 3 weeks. Return to the clinic or go to the nearest emergency room if any of your symptoms worsen or new symptoms occur.

## 2022-10-01 NOTE — Progress Notes (Addendum)
Subjective:  Patient ID: Ashley Gardner, female    DOB: [birth year]  Age: 41 y.o. MRN: [id number]  CC:  Chief Complaint  Patient presents with   Hypertension    Pt states all is well    HPI [name] presents for   Follow-up hypertension.  See previous visits.  Question of secondary hypertension given degree of elevation and recent onset.  Neuroimaging through ER was reassuring given previous headaches and family history of AVM.  Borderline short PR interval on last EKG, plan for continued monitoring, with additional 25 mg of metoprolol if needed in addition to her amlodipine.  Started 5 mg on October 9.  There is also a question of her Wellbutrin contributing to high blood pressure, had been on 200 mg twice daily and decrease to once per day.  Blood pressures have been improving. Given elevated blood pressures, we also discussed stopping Ritalin for now.  24-hour urine ordered, collected for today.  Referred to hypertension clinic. Renal US pending - November 6th.   Home readings 150/90's. Lowest reading 129/95. Did not take any extra doses of metoprolol.  No further near syncopal symptoms.  Neuro appt later today with HA/wellness.  Alcohol - some increase past weekend with celebrating birthday. Off meds yesterday. Back on meds today. Usually rare alcohol.   BP Readings from Last 3 Encounters:  10/01/22 138/88  09/24/22 138/88  09/16/22 (!) 167/102   Anxiety: Had some increased anxiety on lower dose Wellbutrin.  Option of higher dose BuSpar 10 mg 3 times daily at her last visit October 16. On 10mg  tid. Slight fatigue after dose, then better after lying down for 20-60min. Did not have on other dose    History Patient Active Problem List   Diagnosis Date Noted   Adult attention deficit hyperactivity disorder 10/01/2022   Gestational diabetes mellitus 10/01/2022   Hypertensive disorder 10/01/2022   ADD (attention deficit disorder) 09/2020   Family history of early CAD  09/30/2017   Elevated blood pressure reading 08/06/2017   Mixed anxiety and depressive disorder 08/06/2017   Insomnia 08/06/2017   Current mild episode of major depressive disorder without prior episode ([place]) 08/06/2017   Past Medical History:  Diagnosis Date   Anxiety    Decreased concentration, Excessive worry, insomnia, irritability, nervous/anxious behavior,obessions(food) restlessness.   Depression    Feelings of hopelessness, feeling of worthlessness   Hyperlipidemia    Hypersomnia    Palpitations    Restlessness    Past Surgical History:  Procedure Laterality Date   LEFT MASS BIOPSY     Benign appearing fragments of  bone and fibrous tissue, deferential     Allergies  Allergen Reactions   Penicillin G Hives   Penicillins Hives   Prior to Admission medications   Medication Sig Start Date End Date Taking? Authorizing Provider  amLODipine (NORVASC) 5 MG tablet Take 1 tablet (5 mg total) by mouth daily. 09/17/22  Yes [name], MD  buPROPion (WELLBUTRIN SR) 200 MG 12 hr tablet TAKE 1 TABLET (200 MG TOTAL) BY MOUTH 2 (TWO) TIMES DAILY. TAKE 1 TABLET BY MOUTH TWICE A DAY 04/23/22  Yes [name], MD  busPIRone (BUSPAR) 10 MG tablet Take 1 tablet (10 mg total) by mouth 3 (three) times daily. 09/24/22  Yes [name], MD  Levonorgest-Eth Radene Journey 91-Day (SEASONIQUE PO) Take 1 Dose by mouth 1 day or 1 dose.   Yes [provider]  metoprolol succinate (TOPROL-XL) 50 MG 24 hr tablet  Take 1 tablet (50 mg total) by mouth daily. Take with or immediately following a meal. 09/11/22  Yes Worthy Rancher B, FNP  methylphenidate (RITALIN) 20 MG tablet Take 1 tablet (20 mg total) by mouth 2 (two) times daily with breakfast and lunch. Patient not taking: Reported on 10/01/2022 05/02/22   Shade Flood, MD  methylphenidate (RITALIN) 20 MG tablet Take 1 tablet (20 mg total) by mouth 2 (two) times daily with breakfast and lunch. Patient not taking: Reported on  10/01/2022 08/18/22   Shade Flood, MD   Social History   Socioeconomic History   Marital status: Divorced    Spouse name: Not on file   Number of children: Not on file   Years of education: Not on file   Highest education level: Not on file  Occupational History   Not on file  Tobacco Use   Smoking status: Never   Smokeless tobacco: Never  Vaping Use   Vaping Use: Never used  Substance and Sexual Activity   Alcohol use: Yes    Comment: occ   Drug use: No   Sexual activity: Yes    Birth control/protection: Pill  Other Topics Concern   Not on file  Social History Narrative   Not on file   Social Determinants of Health   Financial Resource Strain: Not on file  Food Insecurity: Not on file  Transportation Needs: Not on file  Physical Activity: Not on file  Stress: Not on file  Social Connections: Not on file  Intimate Partner Violence: Not on file    Review of Systems  Constitutional:  Negative for fatigue and unexpected weight change.  Respiratory:  Negative for chest tightness and shortness of breath.   Cardiovascular:  Negative for chest pain, palpitations and leg swelling.  Gastrointestinal:  Negative for abdominal pain and blood in stool.  Neurological:  Positive for headaches (same.). Negative for dizziness, syncope and light-headedness.     Objective:   Vitals:   10/01/22 1049  BP: 138/88  Pulse: 79  Temp: 98.5 F (36.9 C)  SpO2: 98%  Weight: 149 lb (67.6 kg)  Height: 5' (1.524 m)     Physical Exam Vitals reviewed.  Constitutional:      Appearance: Normal appearance. She is well-developed.  HENT:     Head: Normocephalic and atraumatic.  Eyes:     Conjunctiva/sclera: Conjunctivae normal.     Pupils: Pupils are equal, round, and reactive to light.  Neck:     Vascular: No carotid bruit.  Cardiovascular:     Rate and Rhythm: Normal rate and regular rhythm.     Heart sounds: Normal heart sounds.  Pulmonary:     Effort: Pulmonary effort is  normal.     Breath sounds: Normal breath sounds.  Abdominal:     Palpations: Abdomen is soft. There is no pulsatile mass.     Tenderness: There is no abdominal tenderness.  Musculoskeletal:     Right lower leg: No edema.     Left lower leg: No edema.  Skin:    General: Skin is warm and dry.  Neurological:     Mental Status: She is alert and oriented to person, place, and time.  Psychiatric:        Mood and Affect: Mood normal.        Behavior: Behavior normal.     Assessment & Plan:  KRISTIE BRACEWELL is a 41 y.o. female . Hypertension, unspecified type - Plan: amLODipine (NORVASC) 2.5 MG  tablet  -Improving control, still slight elevation, add additional 2.5 mg amlodipine.  Continue follow-up for renal ultrasound, and 24-hour urine.  Recheck 3 weeks.  RTC precautions if worse or new symptoms sooner.  Depression with anxiety - Plan: buPROPion (WELLBUTRIN SR) 200 MG 12 hr tablet  -Some difficulty with focus off Ritalin.  Side effects with higher dose of BuSpar.  Return to previous dosing, can restart twice daily dosing of Wellbutrin with close monitoring of blood pressure.  If that increases, return to daily dosing.  3-week follow-up.  Continue to hold Ritalin for now until persistent stability of hypertension.  Meds ordered this encounter  Medications   buPROPion (WELLBUTRIN SR) 200 MG 12 hr tablet    Sig: Take 1 tablet (200 mg total) by mouth 2 (two) times daily. TAKE 1 TABLET BY MOUTH TWICE A DAY    Dispense:  180 tablet    Refill:  1   amLODipine (NORVASC) 2.5 MG tablet    Sig: Take 1 tablet (2.5 mg total) by mouth daily.    Dispense:  30 tablet    Refill:  1   Patient Instructions  Ok to restart wellbutrin twice per day but return to daily dosing if any increase in blood pressure.  Add additional 2.5mg  amlodipine - total dose 7.5mg .  Ok to return to prior dose Buspar.  Recheck in next 3 weeks. Return to the clinic or go to the nearest emergency room if any of your symptoms  worsen or new symptoms occur.      Signed,   Meredith Staggers, MD [facility], [facility] 10/01/22 1:55 PM

## 2022-10-02 ENCOUNTER — Telehealth: Payer: Self-pay | Admitting: Family Medicine

## 2022-10-02 NOTE — Telephone Encounter (Signed)
Received a request and release medical information form from [facility]. They are requesting the last 6 months of lab results and EKG for this pt. Pt had a EKG done recently, but I don't see it in her chart . Do we have a print out of pt recent  EKG. Placed form in front bin.

## 2022-10-02 NOTE — Telephone Encounter (Signed)
I have printed labs and office notes I do not see the EKG In the chart we will ask DR Carlota Raspberry if he has a copy

## 2022-10-03 ENCOUNTER — Ambulatory Visit: Payer: Commercial Managed Care - PPO | Admitting: Family Medicine

## 2022-10-03 NOTE — Telephone Encounter (Signed)
Placed request in your sign folder, we do not have a current EKG looks like it was ordered and not saved

## 2022-10-03 NOTE — Telephone Encounter (Signed)
My EKG interpretation is in my note but unfortunately it does not look like the EKG was saved to the chart.  I notated this on the paper request from headache specialist and placed in fax bin.

## 2022-10-04 ENCOUNTER — Other Ambulatory Visit: Payer: Commercial Managed Care - PPO

## 2022-10-04 NOTE — Telephone Encounter (Signed)
I did not see this in the to be faxed bin did you already get this sent out?

## 2022-10-05 ENCOUNTER — Encounter: Payer: Self-pay | Admitting: Family Medicine

## 2022-10-05 NOTE — Telephone Encounter (Signed)
Patient wants to know if it is okay to go back to once daily dosing of welbutrin due to suspected side effect with this medication patient notes she is waking up at night and having trouble staying asleep  Please advise

## 2022-10-09 ENCOUNTER — Encounter: Payer: Self-pay | Admitting: Family Medicine

## 2022-10-09 LAB — METANEPHRINES, URINE, 24 HOUR
METANEPHRINE: 42 mcg/24 h — ABNORMAL LOW (ref 58–203)
METANEPHRINES, TOTAL: 346 mcg/24 h (ref 182–739)
NORMETANEPHRINE: 304 mcg/24 h (ref 88–649)
Total Volume: 1000 mL

## 2022-10-09 NOTE — Telephone Encounter (Signed)
Patient is reporting back about her lack of success with the melatonin and is asking next steps  Please advise

## 2022-10-09 NOTE — Telephone Encounter (Signed)
EKG was not saved at the time it was performed, Dr Carlota Raspberry has noted his interpretation is on the forms but there is no hard copy of the scan due to save error

## 2022-10-10 ENCOUNTER — Other Ambulatory Visit: Payer: Self-pay | Admitting: Lab

## 2022-10-10 ENCOUNTER — Other Ambulatory Visit: Payer: Self-pay | Admitting: Family Medicine

## 2022-10-10 DIAGNOSIS — I10 Essential (primary) hypertension: Secondary | ICD-10-CM

## 2022-10-10 MED ORDER — AMLODIPINE BESYLATE 5 MG PO TABS: 5.0000 mg | ORAL_TABLET | Freq: Every day | ORAL | 1 refills | Status: DC

## 2022-10-11 NOTE — Telephone Encounter (Signed)
Okay for patient to take magnesium?

## 2022-10-12 ENCOUNTER — Encounter: Payer: Self-pay | Admitting: *Deleted

## 2022-10-15 ENCOUNTER — Ambulatory Visit
Admission: RE | Admit: 2022-10-15 | Discharge: 2022-10-15 | Disposition: A | Discharge status: Home / Self Care | Payer: Commercial Managed Care - PPO | Source: Ambulatory Visit | Attending: Family Medicine | Admitting: Family Medicine

## 2022-10-15 DIAGNOSIS — I10 Essential (primary) hypertension: Secondary | ICD-10-CM

## 2022-10-16 ENCOUNTER — Encounter: Payer: Self-pay | Admitting: Family Medicine

## 2022-10-16 NOTE — Telephone Encounter (Signed)
Patient is requesting clarification of this paragraph from her imaging  "Diffuse increased echogenicity of the visualized portions of the hepatic parenchyma are a nonspecific indicator of hepatocellular dysfunction, most commonly steatosis."

## 2022-10-16 NOTE — Telephone Encounter (Signed)
Wants to know if being on meds for cholesterol would help with fatty liver

## 2022-10-18 ENCOUNTER — Other Ambulatory Visit: Payer: Self-pay | Admitting: Family Medicine

## 2022-10-18 DIAGNOSIS — F411 Generalized anxiety disorder: Secondary | ICD-10-CM

## 2022-10-23 ENCOUNTER — Other Ambulatory Visit: Payer: Self-pay | Admitting: Family Medicine

## 2022-10-23 DIAGNOSIS — I10 Essential (primary) hypertension: Secondary | ICD-10-CM

## 2022-10-24 ENCOUNTER — Telehealth: Payer: Commercial Managed Care - PPO | Admitting: Physician Assistant

## 2022-10-24 DIAGNOSIS — J019 Acute sinusitis, unspecified: Secondary | ICD-10-CM | POA: Diagnosis not present

## 2022-10-24 DIAGNOSIS — B9789 Other viral agents as the cause of diseases classified elsewhere: Secondary | ICD-10-CM | POA: Diagnosis not present

## 2022-10-24 NOTE — Progress Notes (Signed)

## 2022-10-24 NOTE — Progress Notes (Signed)
I have spent 5 minutes in review of e-visit questionnaire, review and updating patient chart, medical decision making and response to patient.   Carold Eisner Cody Lucila Klecka, PA-C    

## 2022-10-25 ENCOUNTER — Ambulatory Visit: Payer: Commercial Managed Care - PPO | Admitting: Family Medicine

## 2022-10-29 ENCOUNTER — Ambulatory Visit (INDEPENDENT_AMBULATORY_CARE_PROVIDER_SITE_OTHER): Payer: Commercial Managed Care - PPO | Admitting: Family Medicine

## 2022-10-29 ENCOUNTER — Encounter: Payer: Self-pay | Admitting: Family Medicine

## 2022-10-29 VITALS — BP 138/80 | HR 72 | Temp 98.4°F | Ht 60.0 in | Wt 147.2 lb

## 2022-10-29 DIAGNOSIS — I10 Essential (primary) hypertension: Secondary | ICD-10-CM | POA: Diagnosis not present

## 2022-10-29 DIAGNOSIS — F418 Other specified anxiety disorders: Secondary | ICD-10-CM | POA: Diagnosis not present

## 2022-10-29 DIAGNOSIS — G4700 Insomnia, unspecified: Secondary | ICD-10-CM | POA: Diagnosis not present

## 2022-10-29 DIAGNOSIS — E781 Pure hyperglyceridemia: Secondary | ICD-10-CM

## 2022-10-29 DIAGNOSIS — K760 Fatty (change of) liver, not elsewhere classified: Secondary | ICD-10-CM

## 2022-10-29 DIAGNOSIS — F902 Attention-deficit hyperactivity disorder, combined type: Secondary | ICD-10-CM

## 2022-10-29 MED ORDER — METHYLPHENIDATE HCL 20 MG PO TABS: 20.0000 mg | ORAL_TABLET | Freq: Two times a day (BID) | ORAL | 0 refills | Status: DC

## 2022-10-29 NOTE — Progress Notes (Unsigned)
Subjective:  Patient ID: Ashley Gardner, female    DOB: 07-22-1981  Age: 41 y.o. MRN: [id number]  CC:  Chief Complaint  Patient presents with   Hypertension    Pt states all is well    HPI Ashley Gardner presents for   Hypertension: See prior visits, question of possible secondary hypertension given degree of elevation and recent onset.  Borderline short PR interval on EKG with plan for continued monitoring.  Neuroimaging through ER visit previously reassuring given previous headaches and family history of AVM.  Some question whether Wellbutrin may have contributed to her high blood pressure, had been on 200 mg twice daily and decreased to daily dosing.  Blood pressures were improving at her October 23 visit.    Option of additional dose of metoprolol but had not required at her October 23 visit.  Still slight elevation at that time, amlodipine was increased to a total of 7.5 mg daily.   Renal ultrasound 10/15/2022 without evidence of significant renal artery stenosis.  Of note possible hepatic steatosis Noted. 24-hour urine for metanephrines was normal, low normal at 42. She has been referred to hypertension clinic-appears on referral notes that message has been left to have her call and schedule appointment with Dr. Duke Salvia. Still taking amlodipine 7.5mg  QD.   BP Readings from Last 3 Encounters:  10/29/22 138/80  10/01/22 138/88  09/24/22 138/88   Abnormal liver ultrasound, possible hepatic steatosis As above, noted on renal artery scan.  She does request lipid panel, reportedly high triglycerides previously.  Also discussed formal ultrasound of liver for further evaluation with possible GI follow-up. Hypertriglyceridemia at weight loss clinic in past - no meds.  Last ate 3 hrs ago - lab visit planned.  Reviewed previous labs from patient's phone, 06/19/2021, total cholesterol 281 triglycerides 160 HDL 56 LDL 193   Anxiety with insomnia See prior notes.  Treated with BuSpar 10  mg 3 times dailyWe did temporarily decrease her Wellbutrin due to possible contribution to elevated blood pressure.  Has had some chronic headaches and seen headache specialist.  MyChart message from October 31.  Melatonin was not helpful, waking up in the middle the night.  Recommended she return to her usual Wellbutrin dosage as that seemed to help sleep previously.  Did recommend to cut back on melatonin to no more than 10 mg nightly.  Option to add Unisom. Now back on Wellbutrin twice per day. Sleeping better.  On 10mg  melatonin No relief with unisom.  Recently treated by E visit for sinusitis on November 15. Treated with mucinex. Saw [facility] last week, treated with ABX for sinus infection and ear infection - better after azithromycin.  Trouble with focus off ritalin - would like to restart if possible.  Controlled substance database (PDMP) reviewed. No concerns appreciated. 08/18/22 - #30 filled.      10/29/2022   10:04 AM 09/05/2022    8:08 AM 04/04/2022    8:19 AM 02/23/2022    8:34 AM 08/25/2021   10:25 AM  Depression screen PHQ 2/9  Decreased Interest 0 1 1 1 1   Down, Depressed, Hopeless 0 1 1 1 1   PHQ - 2 Score 0 2 2 2 2   Altered sleeping 0 2 1 2 3   Tired, decreased energy 0 3 3 3 3   Change in appetite 0 0 1 3 1   Feeling bad or failure about yourself  0 0 1 2 2   Trouble concentrating 0 1 1 3  3  Moving slowly or fidgety/restless 0 0 0 0 0  Suicidal thoughts 0 0 0 0 0  PHQ-9 Score 0 8 9 15 14      History Patient Active Problem List   Diagnosis Date Noted   Hypertriglyceridemia 10/29/2022   Adult attention deficit hyperactivity disorder 10/01/2022   Gestational diabetes mellitus 10/01/2022   Hypertensive disorder 10/01/2022   ADD (attention deficit disorder) 09/2020   Family history of early CAD 09/30/2017   Elevated blood pressure reading 08/06/2017   Mixed anxiety and depressive disorder 08/06/2017   Insomnia 08/06/2017   Current mild episode of major depressive  disorder without prior episode ([facility]) 08/06/2017   Past Medical History:  Diagnosis Date   Anxiety    Decreased concentration, Excessive worry, insomnia, irritability, nervous/anxious behavior,obessions(food) restlessness.   Depression    Feelings of hopelessness, feeling of worthlessness   Hyperlipidemia    Hypersomnia    Palpitations    Restlessness    Past Surgical History:  Procedure Laterality Date   LEFT MASS BIOPSY     Benign appearing fragments of  bone and fibrous tissue, deferential     Allergies  Allergen Reactions   Penicillin G Hives   Penicillins Hives   Prior to Admission medications   Medication Sig Start Date End Date Taking? Authorizing Provider  amLODipine (NORVASC) 2.5 MG tablet TAKE 1 TABLET BY MOUTH EVERY DAY 10/23/22  Yes Shade FloodGreene, Vonzell Lindblad R, MD  amLODipine (NORVASC) 5 MG tablet TAKE 1 TABLET (5 MG TOTAL) BY MOUTH DAILY. 10/10/22  Yes Shade FloodGreene, Mahayla Haddaway R, MD  buPROPion [facility](WELLBUTRIN SR) 200 MG 12 hr tablet Take 1 tablet (200 mg total) by mouth 2 (two) times daily. TAKE 1 TABLET BY MOUTH TWICE A DAY 10/01/22  Yes Shade FloodGreene, Rainen Vanrossum R, MD  busPIRone (BUSPAR) 10 MG tablet TAKE 1 TABLET BY MOUTH THREE TIMES A DAY 10/18/22  Yes Shade FloodGreene, Lotus Santillo R, MD  Levonorgest-Eth E[place]ad 91-Day (SEASONIQUE PO) Take 1 Dose by mouth 1 day or 1 dose.   Yes [provider]  metoprolol succinate (TOPROL-XL) 50 MG 24 hr tablet Take 1 tablet (50 mg total) by mouth daily. Take with or immediately following a meal. 09/11/22  Yes Worthy RancherWebb, Padonda B, FNP  amLODipine (NORVASC) 5 MG tablet Take 1 tablet (5 mg total) by mouth daily. Patient not taking: Reported on 10/29/2022 10/10/22   Shade FloodGreene, Kaidin Boehle R, MD  methylphenidate (RITALIN) 20 MG tablet Take 1 tablet (20 mg total) by mouth 2 (two) times daily with breakfast and lunch. Patient not taking: Reported on 10/01/2022 05/02/22   Shade FloodGreene, Derron Pipkins R, MD  methylphenidate (RITALIN) 20 MG tablet Take 1 tablet (20 mg total) by mouth 2 (two) times daily  with breakfast and lunch. Patient not taking: Reported on 10/01/2022 08/18/22   Shade FloodGreene, Renn Stille R, MD   Social History   Socioeconomic History   Marital status: Divorced    Spouse name: Not on file   Number of children: Not on file   Years of education: Not on file   Highest education level: Not on file  Occupational History   Not on file  Tobacco Use   Smoking status: Never   Smokeless tobacco: Never  Vaping Use   Vaping Use: Never used  Substance and Sexual Activity   Alcohol use: Yes    Comment: occ   Drug use: No   Sexual activity: Yes    Birth control/protection: Pill  Other Topics Concern   Not on file  Social History Narrative   Not on  file   Social Determinants of Health   Financial Resource Strain: Not on file  Food Insecurity: Not on file  Transportation Needs: Not on file  Physical Activity: Not on file  Stress: Not on file  Social Connections: Not on file  Intimate Partner Violence: Not on file    Review of Systems  Constitutional:  Negative for fatigue and unexpected weight change.  Respiratory:  Negative for chest tightness and shortness of breath.   Cardiovascular:  Negative for chest pain, palpitations and leg swelling.  Gastrointestinal:  Negative for abdominal pain and blood in stool.  Neurological:  Positive for headaches (mild today, plans on new glasses.). Negative for dizziness, syncope and light-headedness.     Objective:   Vitals:   10/29/22 1005  BP: 138/80  Pulse: 72  Temp: 98.4 F (36.9 C)  SpO2: 99%  Weight: 147 lb 3.2 oz (66.8 kg)  Height: 5' (1.524 m)     Physical Exam Vitals reviewed.  Constitutional:      Appearance: Normal appearance. She is well-developed.  HENT:     Head: Normocephalic and atraumatic.  Eyes:     Conjunctiva/sclera: Conjunctivae normal.     Pupils: Pupils are equal, round, and reactive to light.  Neck:     Vascular: No carotid bruit.  Cardiovascular:     Rate and Rhythm: Normal rate and regular  rhythm.     Heart sounds: Normal heart sounds.  Pulmonary:     Effort: Pulmonary effort is normal.     Breath sounds: Normal breath sounds.  Abdominal:     Palpations: Abdomen is soft. There is no pulsatile mass.     Tenderness: There is no abdominal tenderness.  Musculoskeletal:     Right lower leg: No edema.     Left lower leg: No edema.  Skin:    General: Skin is warm and dry.  Neurological:     Mental Status: She is alert and oriented to person, place, and time.  Psychiatric:        Mood and Affect: Mood normal.        Behavior: Behavior normal.        Assessment & Plan:  Ashley Gardner is a 40 y.o. female . Hypertension, unspecified type  -Improved control.  Option of 10 mg of amlodipine.  Home monitoring with MyChart updates, recheck 3 months with RTC precautions if new or worsening symptoms.  Did recommend that she call advanced hypertension clinic for appointment to discuss if other testing needed for secondary hypertension as previously discussed.  Depression with anxiety Insomnia, unspecified type  -Overall stable.  No med changes for now.  Hepatic steatosis - Plan: US Abdomen Limited RUQ (LIVER/GB)  -Check quadrant ultrasound, then possible GI eval.  CMP to check LFTs.  Hypertriglyceridemia - Plan: Comprehensive metabolic panel, Lipid panel  -Mixed hyperlipidemia with mild hypertriglyceridemia on prior labs.  Repeat lipid panel, CMP.  Attention deficit hyperactivity disorder (ADHD), combined type - Plan: methylphenidate (RITALIN) 20 MG tablet  -Uncontrolled off meds, restart Ritalin with close monitoring of blood pressure.  Update on symptoms next month, and then can refill until follow-up visit if stable.  Meds ordered this encounter  Medications   methylphenidate (RITALIN) 20 MG tablet    Sig: Take 1 tablet (20 mg total) by mouth 2 (two) times daily with breakfast and lunch.    Dispense:  60 tablet    Refill:  0   Patient Instructions  Call Dr.  Leonides Sake office to schedule  appt for evaluation of the blood pressure. You have option of 10mg  amlodipine to see if that is tolerated or can stay at same dose.  Ok to restart ritalin. Keep a record of your blood pressures outside of the office and they increase significantly may need to stop it.  I will order liver ultrasound and check labs - lab visit  Follow up in 3 months, let me know about med refills in the meantime.        Signed,   , MD [facility], [facility] 10/30/22 10:56 AM

## 2022-10-29 NOTE — Patient Instructions (Addendum)
Call Dr. Leonides Sake office to schedule appt for evaluation of the blood pressure. You have option of 10mg  amlodipine to see if that is tolerated or can stay at same dose.  Ok to restart ritalin. Keep a record of your blood pressures outside of the office and they increase significantly may need to stop it.  I will order liver ultrasound and check labs - lab visit  Follow up in 3 months, let me know about med refills in the meantime.

## 2022-10-30 ENCOUNTER — Encounter: Payer: Self-pay | Admitting: Family Medicine

## 2022-10-31 ENCOUNTER — Other Ambulatory Visit (INDEPENDENT_AMBULATORY_CARE_PROVIDER_SITE_OTHER): Payer: Commercial Managed Care - PPO

## 2022-10-31 DIAGNOSIS — E781 Pure hyperglyceridemia: Secondary | ICD-10-CM | POA: Diagnosis not present

## 2022-10-31 LAB — COMPREHENSIVE METABOLIC PANEL
ALT: 16 U/L (ref 0–35)
AST: 12 U/L (ref 0–37)
Albumin: 4.2 g/dL (ref 3.5–5.2)
Alkaline Phosphatase: 46 U/L (ref 39–117)
BUN: 17 mg/dL (ref 6–23)
CO2: 26 mEq/L (ref 19–32)
Calcium: 9.1 mg/dL (ref 8.4–10.5)
Chloride: 105 mEq/L (ref 96–112)
Creatinine, Ser: 1.08 mg/dL (ref 0.40–1.20)
GFR: 63.55 mL/min (ref >60.00)
Glucose, Bld: 107 mg/dL — ABNORMAL HIGH (ref 70–99)
Potassium: 4.1 mEq/L (ref 3.5–5.1)
Sodium: 137 mEq/L (ref 135–145)
Total Bilirubin: 0.4 mg/dL (ref 0.2–1.2)
Total Protein: 7.1 g/dL (ref 6.0–8.3)

## 2022-10-31 LAB — LIPID PANEL
Cholesterol: 230 mg/dL — ABNORMAL HIGH (ref 0–200)
HDL: 44.6 mg/dL (ref >39.00)
LDL Cholesterol: 153 mg/dL — ABNORMAL HIGH (ref 0–99)
NonHDL: 185.82
Total CHOL/HDL Ratio: 5
Triglycerides: 164 mg/dL — ABNORMAL HIGH (ref 0.0–149.0)
VLDL: 32.8 mg/dL (ref 0.0–40.0)

## 2022-11-15 ENCOUNTER — Other Ambulatory Visit (HOSPITAL_COMMUNITY): Payer: Self-pay

## 2022-12-21 ENCOUNTER — Other Ambulatory Visit: Payer: Self-pay | Admitting: Family Medicine

## 2022-12-21 ENCOUNTER — Other Ambulatory Visit: Payer: Self-pay | Admitting: Family

## 2022-12-21 DIAGNOSIS — F902 Attention-deficit hyperactivity disorder, combined type: Secondary | ICD-10-CM

## 2022-12-21 MED ORDER — METHYLPHENIDATE HCL 20 MG PO TABS: 20.0000 mg | ORAL_TABLET | Freq: Two times a day (BID) | ORAL | 0 refills | Status: DC

## 2022-12-21 NOTE — Telephone Encounter (Signed)
Controlled substance database reviewed.  Methylphenidate 20 mg #60 last filled 10/29/2022.  Discussed at her November 20 visit.  Refill ordered.

## 2022-12-21 NOTE — Telephone Encounter (Signed)
Ritalin 20 mg  LOV: 11/2/023 Last Refill:10/29/22 Upcoming appt: no apt

## 2022-12-30 ENCOUNTER — Encounter: Payer: Self-pay | Admitting: Family Medicine

## 2023-01-06 ENCOUNTER — Telehealth: Payer: Self-pay | Admitting: Physician Assistant

## 2023-01-06 DIAGNOSIS — H66002 Acute suppurative otitis media without spontaneous rupture of ear drum, left ear: Secondary | ICD-10-CM

## 2023-01-06 MED ORDER — FLUTICASONE PROPIONATE 50 MCG/ACT NA SUSP: 2.0000 | Freq: Every day | NASAL | 0 refills | Status: AC

## 2023-01-06 MED ORDER — AZITHROMYCIN 250 MG PO TABS: ORAL_TABLET | ORAL | 0 refills | Status: AC

## 2023-01-06 NOTE — Progress Notes (Signed)

## 2023-02-14 ENCOUNTER — Telehealth: Payer: Self-pay

## 2023-02-14 NOTE — Telephone Encounter (Signed)
Called to find out where patient has had her Pap done /if she has had one so we may request the records

## 2023-02-15 ENCOUNTER — Other Ambulatory Visit: Payer: Self-pay | Admitting: Family Medicine

## 2023-02-15 DIAGNOSIS — F902 Attention-deficit hyperactivity disorder, combined type: Secondary | ICD-10-CM

## 2023-02-15 MED ORDER — METHYLPHENIDATE HCL 20 MG PO TABS: 20.0000 mg | ORAL_TABLET | Freq: Two times a day (BID) | ORAL | 0 refills | Status: DC

## 2023-02-15 NOTE — Telephone Encounter (Signed)
Medication discussed at her November 20 visit.  Controlled substance database reviewed.  Methylphenidate No. 60 last filled on 12/21/2022, refill ordered.

## 2023-02-15 NOTE — Telephone Encounter (Signed)
Ritalin 20 mg LOV: 10/29/22 Last Refill:12/21/22 Upcoming appt: none

## 2023-04-07 ENCOUNTER — Encounter: Payer: Self-pay | Admitting: Family Medicine

## 2023-04-08 ENCOUNTER — Ambulatory Visit: Payer: BC Managed Care – PPO | Admitting: Family Medicine

## 2023-04-15 ENCOUNTER — Encounter: Payer: Self-pay | Admitting: Family Medicine

## 2023-04-15 ENCOUNTER — Other Ambulatory Visit: Payer: Self-pay | Admitting: Family Medicine

## 2023-04-15 ENCOUNTER — Ambulatory Visit (INDEPENDENT_AMBULATORY_CARE_PROVIDER_SITE_OTHER): Payer: BC Managed Care – PPO | Admitting: Family Medicine

## 2023-04-15 VITALS — BP 130/88 | HR 84 | Temp 98.6°F | Ht 60.0 in | Wt 147.0 lb

## 2023-04-15 DIAGNOSIS — I10 Essential (primary) hypertension: Secondary | ICD-10-CM | POA: Diagnosis not present

## 2023-04-15 DIAGNOSIS — R600 Localized edema: Secondary | ICD-10-CM

## 2023-04-15 DIAGNOSIS — E781 Pure hyperglyceridemia: Secondary | ICD-10-CM

## 2023-04-15 DIAGNOSIS — F902 Attention-deficit hyperactivity disorder, combined type: Secondary | ICD-10-CM

## 2023-04-15 DIAGNOSIS — F418 Other specified anxiety disorders: Secondary | ICD-10-CM

## 2023-04-15 DIAGNOSIS — F411 Generalized anxiety disorder: Secondary | ICD-10-CM | POA: Diagnosis not present

## 2023-04-15 LAB — COMPREHENSIVE METABOLIC PANEL
ALT: 29 U/L (ref 0–35)
AST: 24 U/L (ref 0–37)
Albumin: 4.5 g/dL (ref 3.5–5.2)
Alkaline Phosphatase: 52 U/L (ref 39–117)
BUN: 16 mg/dL (ref 6–23)
CO2: 25 mEq/L (ref 19–32)
Calcium: 9.7 mg/dL (ref 8.4–10.5)
Chloride: 101 mEq/L (ref 96–112)
Creatinine, Ser: 0.88 mg/dL (ref 0.40–1.20)
GFR: 81 mL/min (ref >60.00)
Glucose, Bld: 93 mg/dL (ref 70–99)
Potassium: 4.5 mEq/L (ref 3.5–5.1)
Sodium: 135 mEq/L (ref 135–145)
Total Bilirubin: 0.5 mg/dL (ref 0.2–1.2)
Total Protein: 7.4 g/dL (ref 6.0–8.3)

## 2023-04-15 LAB — LIPID PANEL
Cholesterol: 263 mg/dL — ABNORMAL HIGH (ref 0–200)
HDL: 47.5 mg/dL (ref >39.00)
NonHDL: 215.5
Total CHOL/HDL Ratio: 6
Triglycerides: 215 mg/dL — ABNORMAL HIGH (ref 0.0–149.0)
VLDL: 43 mg/dL — ABNORMAL HIGH (ref 0.0–40.0)

## 2023-04-15 LAB — LDL CHOLESTEROL, DIRECT: Direct LDL: 199 mg/dL

## 2023-04-15 MED ORDER — AMLODIPINE BESYLATE 5 MG PO TABS: 5.0000 mg | ORAL_TABLET | Freq: Every day | ORAL | 1 refills | Status: DC

## 2023-04-15 MED ORDER — BUPROPION HCL ER (SR) 200 MG PO TB12: 200.0000 mg | ORAL_TABLET | Freq: Two times a day (BID) | ORAL | 1 refills | Status: DC

## 2023-04-15 MED ORDER — LOSARTAN POTASSIUM 25 MG PO TABS: 25.0000 mg | ORAL_TABLET | Freq: Every day | ORAL | 1 refills | Status: DC

## 2023-04-15 MED ORDER — BUSPIRONE HCL 10 MG PO TABS: 10.0000 mg | ORAL_TABLET | Freq: Three times a day (TID) | ORAL | 1 refills | Status: DC

## 2023-04-15 MED ORDER — METHYLPHENIDATE HCL 20 MG PO TABS: ORAL_TABLET | ORAL | 0 refills | Status: DC

## 2023-04-15 NOTE — Progress Notes (Signed)
Subjective:  Patient ID: Ashley Gardner, female    DOB: November 15, 1981  Age: 42 y.o. MRN: [id number]  CC:  Chief Complaint  Patient presents with   Hypertension    Pt has not keep record of bp    Foot Swelling    Pt states her feet are swelling when standing to long and long car rides    HPI Ashley Gardner presents for   Follow up. 6 yo son Ree Kida doing well [employer], he had surgery in April - recovering with use of prosthetic - working on trying to get him to put weight on it.   Hypertension: See prior visits.  Question of secondary hypertension.  Previous workup including neuroimaging reassuring, with family history of AVM.  Also possible Wellbutrin contributor, had been up to 200 mg twice daily then decrease to once daily.  Renal ultrasound in November without evidence of significant renal artery stenosis.  Prior 24-hour urine for metanephrines was normal. treated with amlodipine, current dose of 7.5 mg daily. Toprol 50mg  qd.  Has noticed some foot swelling past few months.   Noticed when she is standing too long or long car rides. Resolves overnight.  No chest pain or dyspnea.  Home readings: none.  Cutting back on caffeine, more water. Started [employer] for weight mgt.  BP Readings from Last 3 Encounters:  04/15/23 130/88  10/29/22 138/80  10/01/22 138/88   Lab Results  Component Value Date   CREATININE 1.08 10/31/2022   Abnormal liver ultrasound, possible hepatic steatosis Noted on renal artery scan last year.  Normal LFTs in November.  Right upper quadrant dedicated ultrasound ordered, then possible GI eval depending on results.  Ultrasound has not been completed. Defers ultrasound or further workup at this time. No abd pain, n/v/or jaundice, scleral icterus.  Lab Results  Component Value Date   ALT 16 10/31/2022   AST 12 10/31/2022   ALKPHOS 46 10/31/2022   BILITOT 0.4 10/31/2022    Anxiety with insomnia As above Wellbutrin previously 200 mg twice daily,  decreased, then back to twice daily dosing with stable blood pressures.  BuSpar 10 mg 3 times daily and melatonin for sleep has been helpful prior. Mow using Calm drink at night instead of melatonin - works better.  Feels like anxiety is overall managed. Ups and down but wants to stay on same meds.  No si/hi.  No current therapist.      04/15/2023    8:53 AM 09/05/2022    8:09 AM 05/03/2021   10:45 AM 02/03/2021    3:41 PM  GAD 7 : Generalized Anxiety Score  Nervous, Anxious, on Edge 0 1 1 2   Control/stop worrying 1 1 1 1   Worry too much - different things 1 1 1 1   Trouble relaxing 2 2 1 2   Restless 1 1 2 3   Easily annoyed or irritable 3 3 1 3   Afraid - awful might happen 0 0 0 0  Total GAD 7 Score 8 9 7 12   Anxiety Difficulty Somewhat difficult         04/15/2023    8:53 AM 10/29/2022   10:04 AM 09/05/2022    8:08 AM 04/04/2022    8:19 AM 02/23/2022    8:34 AM  Depression screen PHQ 2/9  Decreased Interest 1 0 1 1 1   Down, Depressed, Hopeless 1 0 1 1 1   PHQ - 2 Score 2 0 2 2 2   Altered sleeping 2 0 2 1 2  Tired, decreased energy 2 0 3 3 3   Change in appetite 3 0 0 1 3  Feeling bad or failure about yourself  1 0 0 1 2  Trouble concentrating 2 0 1 1 3   Moving slowly or fidgety/restless 1 0 0 0 0  Suicidal thoughts 0 0 0 0 0  PHQ-9 Score 13 0 8 9 15   Difficult doing work/chores Somewhat difficult        Attention deficit disorder Previously treated with Ritalin, temporarily was held during above issues with blood pressure, then restarted at her November visit with close monitoring of hypertension recommended.  Feels like distracted, all over the place, ADD not well controlled on 20mg  ritalin 2 per day. Morning ok, afternoon with more distraction, about 2pm, 2nd dose meds at 12pm.   History Patient Active Problem List   Diagnosis Date Noted   Hypertriglyceridemia 10/29/2022   Adult attention deficit hyperactivity disorder 10/01/2022   Gestational diabetes mellitus 10/01/2022    Hypertensive disorder 10/01/2022   ADD (attention deficit disorder) 09/2020   Family history of early CAD 09/30/2017   Elevated blood pressure reading 08/06/2017   Mixed anxiety and depressive disorder 08/06/2017   Insomnia 08/06/2017   Current mild episode of major depressive disorder without prior episode (HCC) 08/06/2017   Past Medical History:  Diagnosis Date   Anxiety    Decreased concentration, Excessive worry, insomnia, irritability, nervous/anxious behavior,obessions(food) restlessness.   Depression    Feelings of hopelessness, feeling of worthlessness   Hyperlipidemia    Hypersomnia    Palpitations    Restlessness    Past Surgical History:  Procedure Laterality Date   LEFT MASS BIOPSY     Benign appearing fragments of  bone and fibrous tissue, deferential     Allergies  Allergen Reactions   Penicillin G Hives   Penicillins Hives   Prior to Admission medications   Medication Sig Start Date End Date Taking? Authorizing Provider  amLODipine (NORVASC) 2.5 MG tablet TAKE 1 TABLET BY MOUTH EVERY DAY 10/23/22  Yes Shade Flood, MD  amLODipine (NORVASC) 5 MG tablet Take 1 tablet (5 mg total) by mouth daily. 10/10/22  Yes Shade Flood, MD  amLODipine (NORVASC) 5 MG tablet TAKE 1 TABLET (5 MG TOTAL) BY MOUTH DAILY. 04/15/23  Yes Shade Flood, MD  azithromycin (ZITHROMAX) 250 MG tablet TAKE 2 TABLETS BY MOUTH TODAY, THEN TAKE 1 TABLET DAILY FOR 4 DAYS AS DIRECTED   Yes [provider]  baclofen (LIORESAL) 10 MG tablet TAKE 1 TABLET TWICE A DAY AS NEEDED LIMIT 1-2 TREATMENTS PER WEEK   Yes [provider]  benzonatate (TESSALON) 100 MG capsule SWALLOW WHOLE TAKE 1 CAPSULE 3 TIMES A DAY AS NEEDED *DO NOT BREAK CHEW, DISSOLVE, CUT, OR CRUSH*   Yes [provider]  buPROPion (WELLBUTRIN SR) 200 MG 12 hr tablet Take 1 tablet (200 mg total) by mouth 2 (two) times daily. TAKE 1 TABLET BY MOUTH TWICE A DAY 10/01/22  Yes Shade Flood, MD   busPIRone (BUSPAR) 10 MG tablet TAKE 1 TABLET BY MOUTH THREE TIMES A DAY 10/18/22  Yes Shade Flood, MD  fluticasone [facility]) 50 MCG/ACT nasal spray Place 2 sprays into both nostrils daily. 01/06/23  Yes Margaretann Loveless, PA-C  ipratropium (ATROVENT) 0.06 % nasal spray Place into both nostrils. 10/24/22  Yes [provider]  Terri Piedra 91-Day (SEASONIQUE PO) Take 1 Dose by mouth 1 day or 1 dose.   Yes [provider]  LO LOESTRIN FE 1 MG-10 MCG / 10 MCG tablet Take 1 tablet by mouth daily. 04/07/23  Yes [provider]  methylphenidate (RITALIN) 20 MG tablet Take 1 tablet (20 mg total) by mouth 2 (two) times daily with breakfast and lunch. 05/02/22  Yes Shade Flood, MD  methylphenidate (RITALIN) 20 MG tablet Take 1 tablet (20 mg total) by mouth 2 (two) times daily with breakfast and lunch. 02/15/23  Yes Shade Flood, MD  metoprolol succinate (TOPROL-XL) 50 MG 24 hr tablet Take 1 tablet (50 mg total) by mouth daily. Take with or immediately following a meal. 09/11/22  Yes Worthy Rancher B, FNP  zonisamide (ZONEGRAN) 25 MG capsule Take 100 mg by mouth daily. 11/26/22  Yes [provider]   Social History   Socioeconomic History   Marital status: Divorced    Spouse name: Not on file   Number of children: Not on file   Years of education: Not on file   Highest education level: Some college, no degree  Occupational History   Not on file  Tobacco Use   Smoking status: Never   Smokeless tobacco: Never  Vaping Use   Vaping Use: Never used  Substance and Sexual Activity   Alcohol use: Yes    Comment: occ   Drug use: No   Sexual activity: Yes    Birth control/protection: Pill  Other Topics Concern   Not on file  Social History Narrative   Not on file   Social Determinants of Health   Financial Resource Strain: Low Risk  (04/14/2023)   Overall Financial Resource Strain (CARDIA)    Difficulty of Paying Living Expenses: Not hard  at all  Food Insecurity: No Food Insecurity (04/14/2023)   Hunger Vital Sign    Worried About Running Out of Food in the Last Year: Never true    Ran Out of Food in the Last Year: Never true  Transportation Needs: No Transportation Needs (04/14/2023)   PRAPARE - Administrator, Civil Service (Medical): No    Lack of Transportation (Non-Medical): No  Physical Activity: Sufficiently Active (04/14/2023)   Exercise Vital Sign    Days of Exercise per Week: 5 days    Minutes of Exercise per Session: 60 min  Stress: Stress Concern Present (04/14/2023)   [employer] of Occupational Health - Occupational Stress Questionnaire    Feeling of Stress : Rather much  Social Connections: Socially Isolated (04/14/2023)   Social Connection and Isolation Panel [NHANES]    Frequency of Communication with Friends and Family: Once a week    Frequency of Social Gatherings with Friends and Family: Once a week    Attends Religious Services: More than 4 times per year    Active Member of [employer] or Organizations: No    Attends Engineer, structural: Not on file    Marital Status: Divorced  Intimate Partner Violence: Not on file    Review of Systems   Objective:   Vitals:   04/15/23 0854  BP: 130/88  Pulse: 84  Temp: 98.6 F (37 C)  TempSrc: Temporal  SpO2: 100%  Weight: 147 lb (66.7 kg)  Height: 5' (1.524 m)     Physical Exam Vitals reviewed.  Constitutional:      Appearance: Normal appearance. She is well-developed.  HENT:     Head: Normocephalic and atraumatic.  Eyes:     Conjunctiva/sclera: Conjunctivae normal.     Pupils: Pupils are equal, round, and reactive to light.  Neck:     Vascular: No carotid bruit.  Cardiovascular:     Rate and Rhythm: Normal rate and regular rhythm.     Heart sounds: Normal heart sounds.  Pulmonary:     Effort: Pulmonary effort is normal.     Breath sounds: Normal breath sounds.  Abdominal:     Palpations: Abdomen is soft. There is no  pulsatile mass.     Tenderness: There is no abdominal tenderness.  Musculoskeletal:     Right lower leg: No edema.     Left lower leg: No edema.  Skin:    General: Skin is warm and dry.  Neurological:     Mental Status: She is alert and oriented to person, place, and time.  Psychiatric:        Mood and Affect: Mood normal.        Behavior: Behavior normal.        Assessment & Plan:  Ashley Gardner is a 42 y.o. female . Pedal edema Hypertension, unspecified type - Plan: amLODipine (NORVASC) 5 MG tablet  -Intermittent pedal edema likely related to amlodipine.  Will try lower dose of 5 mg, add losartan 25 mg daily with home monitoring, RTC precautions, virtual visit follow-up in 1 month  Generalized anxiety disorder - Plan: busPIRone (BUSPAR) 10 MG tablet Attention deficit hyperactivity disorder (ADHD), combined type - Plan: methylphenidate (RITALIN) 20 MG tablet Depression with anxiety - Plan: buPROPion (WELLBUTRIN SR) 200 MG 12 hr tablet  Reports overall stable anxiety/mood symptoms but difficulty with focus which may be impacting her other anxiety symptoms as above.  Will try slight higher dose of Ritalin at lunchtime, monitor for increased insomnia or other side effects and if that occurs return to 20 mg dose.  If no change in symptoms with adjustment of Ritalin would recommend meeting with psychiatrist to decide on other medication changes.  Continue buspirone, Wellbutrin same doses for now.  1 month virtual visit follow-up, sooner if needed.  Hypertriglyceridemia, repeat labs, no new meds for now.  Declines further evaluation of possible hepatic steatosis seen previously.  Meds ordered this encounter  Medications   losartan (COZAAR) 25 MG tablet    Sig: Take 1 tablet (25 mg total) by mouth daily.    Dispense:  90 tablet    Refill:  1   amLODipine (NORVASC) 5 MG tablet    Sig: Take 1 tablet (5 mg total) by mouth daily.    Dispense:  30 tablet    Refill:  1   busPIRone  (BUSPAR) 10 MG tablet    Sig: Take 1 tablet (10 mg total) by mouth 3 (three) times daily.    Dispense:  270 tablet    Refill:  1   methylphenidate (RITALIN) 20 MG tablet    Sig: 1 tab po qam, 2 tabs po at lunch.    Dispense:  90 tablet    Refill:  0    New dose.   buPROPion (WELLBUTRIN SR) 200 MG 12 hr tablet    Sig: Take 1 tablet (200 mg total) by mouth 2 (two) times daily. TAKE 1 TABLET BY MOUTH TWICE A DAY    Dispense:  180 tablet    Refill:  1   Patient Instructions  Decrease amlodipine to 5mg  and add losartan low dose once per day. Keep a record of your blood pressures outside of the office and let me know if elevated readings or new side effects.  Can discuss further as well at 1 month  follow-up.  Try increase of Ritalin dose at lunchtime.  If you have new side effects at that dose or more trouble sleeping go back to 1 pill and we can look at other approaches.  No other med changes for now.  Video visit follow-up in 1 month but let me know if there are questions in the meantime.  Take care.      Signed,   Meredith Staggers, MD [facility], [facility] 04/15/23 9:51 AM

## 2023-04-15 NOTE — Patient Instructions (Addendum)
Decrease amlodipine to 5mg  and add losartan low dose once per day. Keep a record of your blood pressures outside of the office and let me know if elevated readings or new side effects.  Can discuss further as well at 1 month follow-up.  Try increase of Ritalin dose at lunchtime.  If you have new side effects at that dose or more trouble sleeping go back to 1 pill and we can look at other approaches.  No other med changes for now.  Video visit follow-up in 1 month but let me know if there are questions in the meantime.  Take care.

## 2023-04-23 ENCOUNTER — Encounter: Payer: Self-pay | Admitting: Family Medicine

## 2023-05-15 ENCOUNTER — Encounter: Payer: Self-pay | Admitting: Family Medicine

## 2023-05-15 ENCOUNTER — Telehealth (INDEPENDENT_AMBULATORY_CARE_PROVIDER_SITE_OTHER): Payer: BC Managed Care – PPO | Admitting: Family Medicine

## 2023-05-15 DIAGNOSIS — F411 Generalized anxiety disorder: Secondary | ICD-10-CM

## 2023-05-15 DIAGNOSIS — F902 Attention-deficit hyperactivity disorder, combined type: Secondary | ICD-10-CM | POA: Diagnosis not present

## 2023-05-15 DIAGNOSIS — I10 Essential (primary) hypertension: Secondary | ICD-10-CM | POA: Diagnosis not present

## 2023-05-15 MED ORDER — BUSPIRONE HCL 15 MG PO TABS: 15.0000 mg | ORAL_TABLET | Freq: Three times a day (TID) | ORAL | 1 refills | Status: DC

## 2023-05-15 MED ORDER — LOSARTAN POTASSIUM 50 MG PO TABS: 50.0000 mg | ORAL_TABLET | Freq: Every day | ORAL | 1 refills | Status: DC

## 2023-05-15 NOTE — Progress Notes (Signed)
Virtual Visit via Video Note  I connected with Ashley Gardner on 05/15/23 at 11:32 AM by a video enabled telemedicine application and verified that I am speaking with the correct person using two identifiers.  Patient location: work/school - by self.  [place] location: office [place].    I discussed the limitations, risks, security and privacy concerns of performing an evaluation and management service by telephone and the availability of in person appointments. I also discussed with the patient that there may be a patient responsible charge related to this service. The patient expressed understanding and agreed to proceed, consent obtained  Chief complaint:  Chief Complaint  Patient presents with   ADHD    Pt notes not feeling focused feels medication could work better    Hypertension    123/84 this am for BP notes mostly has been mid 130s over high 80s and feels this could be better     History of Present Illness: Ashley Gardner is a 42 y.o. female  Anxiety, insomnia, ADHD: See prior notes, last discussed May 6.  BuSpar 10 mg 3 times daily for anxiety, Wellbutrin 200 mg twice daily with overall stable symptoms at that time, some ups and downs but wanted to remain on same medications.  Did not have a therapist at that time, has met with one prior.  ADHD has been treated with Ritalin, temporarily held due to issues with blood pressure then restarted late last year.  Was not well-controlled at 20 mg Ritalin 2/day.  Was having more distraction in the afternoon about 2 PM.  She was taking her second dose of meds around 12.  Increased her Ritalin to 40 mg at lunchtime, continued 20 mg in the morning.   When gets home from school - feels overwhelmed with things to do. Feels more anxious with those things to do - meal prep, other job, clean, get ready for next day, no help. Bed by 10.  No change in focus or distraction at higher dose. Not feeling worse at higher dose.  Irritated, moody  at times.  Did [employer] med run last week - 5th place in age group.  Has lost 15#.    Hypertension: Intermittent pedal edema discussed at her May 6 visit.  Added losartan 25 mg and lowered amlodipine to 5 mg. Toprol 50mg  qd.  Swelling better - gone.  BP 123/84 this morning, has been in the 130s over 80s. High 120-130/80-90  BP Readings from Last 3 Encounters:  04/15/23 130/88  10/29/22 138/80  10/01/22 138/88   Lab Results  Component Value Date   CREATININE 0.88 04/15/2023      Patient Active Problem List   Diagnosis Date Noted   Hypertriglyceridemia 10/29/2022   Adult attention deficit hyperactivity disorder 10/01/2022   Gestational diabetes mellitus 10/01/2022   Hypertensive disorder 10/01/2022   ADD (attention deficit disorder) 09/2020   Family history of early CAD 09/30/2017   Elevated blood pressure reading 08/06/2017   Mixed anxiety and depressive disorder 08/06/2017   Insomnia 08/06/2017   Current mild episode of major depressive disorder without prior episode (HCC) 08/06/2017   Past Medical History:  Diagnosis Date   Anxiety    Decreased concentration, Excessive worry, insomnia, irritability, nervous/anxious behavior,obessions(food) restlessness.   Depression    Feelings of hopelessness, feeling of worthlessness   Hyperlipidemia    Hypersomnia    Palpitations    Restlessness    Past Surgical History:  Procedure Laterality Date   LEFT MASS BIOPSY  Benign appearing fragments of  bone and fibrous tissue, deferential     Allergies  Allergen Reactions   Penicillin G Hives   Penicillins Hives   Prior to Admission medications   Medication Sig Start Date End Date Taking? Authorizing Provider  amLODipine (NORVASC) 5 MG tablet Take 1 tablet (5 mg total) by mouth daily. 04/15/23  Yes Shade Flood, MD  buPROPion [facility] SR) 200 MG 12 hr tablet Take 1 tablet (200 mg total) by mouth 2 (two) times daily. TAKE 1 TABLET BY MOUTH TWICE A DAY 04/15/23  Yes  Shade Flood, MD  busPIRone (BUSPAR) 10 MG tablet Take 1 tablet (10 mg total) by mouth 3 (three) times daily. 04/15/23  Yes Shade Flood, MD  fluticasone (FLONASE) 50 MCG/ACT nasal spray Place 2 sprays into both nostrils daily. 01/06/23  Yes Margaretann Loveless, PA-C  ipratropium (ATROVENT) 0.06 % nasal spray Place into both nostrils. 10/24/22  Yes [provider]  Terri Piedra 91-Day (SEASONIQUE PO) Take 1 Dose by mouth 1 day or 1 dose.   Yes [provider]  LO LOESTRIN FE 1 MG-10 MCG / 10 MCG tablet Take 1 tablet by mouth daily. 04/07/23  Yes [provider]  losartan (COZAAR) 25 MG tablet Take 1 tablet (25 mg total) by mouth daily. 04/15/23  Yes Shade Flood, MD  methylphenidate (RITALIN) 20 MG tablet 1 tab po qam, 2 tabs po at lunch. 04/15/23  Yes Shade Flood, MD  metoprolol succinate (TOPROL-XL) 50 MG 24 hr tablet Take 1 tablet (50 mg total) by mouth daily. Take with or immediately following a meal. 09/11/22  Yes Worthy Rancher B, FNP  zonisamide (ZONEGRAN) 25 MG capsule Take 100 mg by mouth daily. 11/26/22  Yes [provider]   Social History   Socioeconomic History   Marital status: Divorced    Spouse name: Not on file   Number of children: Not on file   Years of education: Not on file   Highest education level: Some college, no degree  Occupational History   Not on file  Tobacco Use   Smoking status: Never   Smokeless tobacco: Never  Vaping Use   Vaping Use: Never used  Substance and Sexual Activity   Alcohol use: Yes    Comment: occ   Drug use: No   Sexual activity: Yes    Birth control/protection: Pill  Other Topics Concern   Not on file  Social History Narrative   Not on file   Social Determinants of Health   Financial Resource Strain: Low Risk  (04/14/2023)   Overall Financial Resource Strain (CARDIA)    Difficulty of Paying Living Expenses: Not hard at all  Food Insecurity: No Food Insecurity (04/14/2023)    Hunger Vital Sign    Worried About Running Out of Food in the Last Year: Never true    Ran Out of Food in the Last Year: Never true  Transportation Needs: No Transportation Needs (04/14/2023)   PRAPARE - Administrator, Civil Service (Medical): No    Lack of Transportation (Non-Medical): No  Physical Activity: Sufficiently Active (04/14/2023)   Exercise Vital Sign    Days of Exercise per Week: 5 days    Minutes of Exercise per Session: 60 min  Stress: Stress Concern Present (04/14/2023)   [employer] of Occupational Health - Occupational Stress Questionnaire    Feeling of Stress : Rather much  Social Connections: Socially Isolated (04/14/2023)   Social Connection  and Isolation Panel [NHANES]    Frequency of Communication with Friends and Family: Once a week    Frequency of Social Gatherings with Friends and Family: Once a week    Attends Religious Services: More than 4 times per year    Active Member of [employer] or Organizations: No    Attends Engineer, structural: Not on file    Marital Status: Divorced  Intimate Partner Violence: Not on file    Observations/Objective: There were no vitals filed for this visit. Good eye contact, Euthymic mood on exam.  Nontoxic appearance.  Appropriate responses, does not appear to be responding to internal stimuli Assessment and Plan: Hypertension, unspecified type - Plan: losartan (COZAAR) 50 MG tablet  -Slight improved control, still running a little bit too high, pedal edema is improved/resolved.  Continue same dose of amlodipine, slight increased dose of losartan, continue Toprol same dose in 6-week follow-up.  Generalized anxiety disorder - Plan: busPIRone (BUSPAR) 15 MG tablet, Ambulatory referral to Psychiatry  Attention deficit hyperactivity disorder (ADHD), combined type - Plan: Ambulatory referral to Psychiatry  Suspect most of her symptoms may be due to uncontrolled anxiety, less likely ADD.  Can revert back to 20  mg twice daily dosing of Ritalin, trial of slight higher dose of buspirone, refer to psychiatry to discuss other medication options as I suspect we may need to make further changes.  Continue Wellbutrin same dose for now.  And recommended restarting therapy, phone number provided.  6-week follow-up.  Follow Up Instructions:   Patient Instructions  I do recommend lining up new therapist to discuss anxiety and planning to help with your anxiety.  Here is an option for counseling- please call to schedule that appointment.   [facility]:  [phone]  Let's try temporary increase in Buspar to 15mg  - with plan for increasing one dose every few days up to 15mg  thee times per day. No change in Ritalin or Wellbutrin for now.   Increase losartan to 50mg  per day.   If no change in ADD on higher dose ritalin, change back to 20 mg 2 times per day.   Return to the clinic or go to the nearest emergency room if any of your symptoms worsen or new symptoms occur.  Managing Anxiety, Adult After being diagnosed with anxiety, you may be relieved to know why you have felt or behaved a certain way. You may also feel overwhelmed about the treatment ahead and what it will mean for your life. With care and support, you can manage your anxiety. How to manage lifestyle changes Understanding the difference between stress and anxiety Although stress can play a role in anxiety, it is not the same as anxiety. Stress is your body's reaction to life changes and events, both good and bad. Stress is often caused by something external, such as a deadline, test, or competition. It normally goes away after the event has ended and will last just a few hours. But, stress can be ongoing and can lead to more than just stress. Anxiety is caused by something internal, such as imagining a terrible outcome or worrying that something will go wrong that will greatly upset you. Anxiety often does not go away even  after the event is over, and it can become a long-term (chronic) worry. Lowering stress and anxiety Talk with your health care provider or a counselor to learn more about lowering anxiety and stress. They may suggest tension-reduction techniques, such as: Music. Spend time creating or  listening to music that you enjoy and that inspires you. Mindfulness-based meditation. Practice being aware of your normal breaths while not trying to control your breathing. It can be done while sitting or walking. Centering prayer. Focus on a word, phrase, or sacred image that means something to you and brings you peace. Deep breathing. Expand your stomach and inhale slowly through your nose. Hold your breath for 3-5 seconds. Then breathe out slowly, letting your stomach muscles relax. Self-talk. Learn to notice and spot thought patterns that lead to anxiety reactions. Change those patterns to thoughts that feel peaceful. Muscle relaxation. Take time to tense muscles and then relax them. Choose a tension-reduction technique that fits your lifestyle and personality. These techniques take time and practice. Set aside 5-15 minutes a day to do them. Specialized therapists can offer counseling and training in these techniques. The training to help with anxiety may be covered by some insurance plans. Other things you can do to manage stress and anxiety include: Keeping a stress diary. This can help you learn what triggers your reaction and then learn ways to manage your response. Thinking about how you react to certain situations. You may not be able to control everything, but you can control your response. Making time for activities that help you relax and not feeling guilty about spending your time in this way. Doing visual imagery. This involves imagining or creating mental pictures to help you relax. Practicing yoga. Through yoga poses, you can lower tension and relax.  Medicines Medicines for anxiety  include: Antidepressant medicines. These are usually prescribed for long-term daily control. Anti-anxiety medicines. These may be added in severe cases, especially when panic attacks occur. When used together, medicines, psychotherapy, and tension-reduction techniques may be the most effective treatment. Relationships Relationships can play a big part in helping you recover. Spend more time connecting with trusted friends and family members. Think about going to couples counseling if you have a partner, taking family education classes, or going to family therapy. Therapy can help you and others better understand your anxiety. How to recognize changes in your anxiety Everyone responds differently to treatment for anxiety. Recovery from anxiety happens when symptoms lessen and stop interfering with your daily life at home or work. This may mean that you will start to: Have better concentration and focus. Worry will interfere less in your daily thinking. Sleep better. Be less irritable. Have more energy. Have improved memory. Try to recognize when your condition is getting worse. Contact your provider if your symptoms interfere with home or work and you feel like your condition is not improving. Follow these instructions at home: Activity Exercise. Adults should: Exercise for at least 150 minutes each week. The exercise should increase your heart rate and make you sweat (moderate-intensity exercise). Do strengthening exercises at least twice a week. Get the right amount and quality of sleep. Most adults need 7-9 hours of sleep each night. Lifestyle  Eat a healthy diet that includes plenty of vegetables, fruits, whole grains, low-fat dairy products, and lean protein. Do not eat a lot of foods that are high in fats, added sugars, or salt (sodium). Make choices that simplify your life. Do not use any products that contain nicotine or tobacco. These products include cigarettes, chewing tobacco, and  vaping devices, such as e-cigarettes. If you need help quitting, ask your provider. Avoid caffeine, alcohol, and certain over-the-counter cold medicines. These may make you feel worse. Ask your pharmacist which medicines to avoid. General instructions Take over-the-counter  and prescription medicines only as told by your provider. Keep all follow-up visits. This is to make sure you are managing your anxiety well or if you need more support. Where to find support You can get help and support from: Self-help groups. Online and [employer]. A trusted spiritual leader. Couples counseling. Family education classes. Family therapy. Where to find more information You may find that joining a support group helps you deal with your anxiety. The following sources can help you find counselors or support groups near you: Mental Health America: mentalhealthamerica.net Anxiety and Depression Association of [place] (ADAA): adaa.org The First American on Mental Illness (NAMI): nami.org Contact a health care provider if: You have a hard time staying focused or finishing tasks. You spend many hours a day feeling worried about everyday life. You are very tired because you cannot stop worrying. You start to have headaches or often feel tense. You have chronic nausea or diarrhea. Get help right away if: Your heart feels like it is racing. You have shortness of breath. You have thoughts of hurting yourself or others. Get help right away if you feel like you may hurt yourself or others, or have thoughts about taking your own life. Go to your nearest emergency room or: Call 911. Call the National Suicide Prevention Lifeline at 3397739961 or 988. This is open 24 hours a day. Text the Crisis Text Line at 603-238-3600. This information is not intended to replace advice given to you by your health care provider. Make sure you discuss any questions you have with your health care provider. Document  Revised: 09/04/2022 Document Reviewed: 03/19/2021 Elsevier Patient Education  2024 Elsevier Inc.  6-week follow-up   I discussed the assessment and treatment plan with the patient. The patient was provided an opportunity to ask questions and all were answered. The patient agreed with the plan and demonstrated an understanding of the instructions.   The patient was advised to call back or seek an in-person evaluation if the symptoms worsen or if the condition fails to improve as anticipated.   Shade Flood, MD

## 2023-05-15 NOTE — Patient Instructions (Addendum)
I do recommend lining up new therapist to discuss anxiety and planning to help with your anxiety.  Here is an option for counseling- please call to schedule that appointment.   [facility]:  [phone]  Let's try temporary increase in Buspar to 15mg  - with plan for increasing one dose every few days up to 15mg  thee times per day. No change in Ritalin or Wellbutrin for now.   Increase losartan to 50mg  per day.   If no change in ADD on higher dose ritalin, change back to 20 mg 2 times per day.   Return to the clinic or go to the nearest emergency room if any of your symptoms worsen or new symptoms occur.  Managing Anxiety, Adult After being diagnosed with anxiety, you may be relieved to know why you have felt or behaved a certain way. You may also feel overwhelmed about the treatment ahead and what it will mean for your life. With care and support, you can manage your anxiety. How to manage lifestyle changes Understanding the difference between stress and anxiety Although stress can play a role in anxiety, it is not the same as anxiety. Stress is your body's reaction to life changes and events, both good and bad. Stress is often caused by something external, such as a deadline, test, or competition. It normally goes away after the event has ended and will last just a few hours. But, stress can be ongoing and can lead to more than just stress. Anxiety is caused by something internal, such as imagining a terrible outcome or worrying that something will go wrong that will greatly upset you. Anxiety often does not go away even after the event is over, and it can become a long-term (chronic) worry. Lowering stress and anxiety Talk with your health care provider or a counselor to learn more about lowering anxiety and stress. They may suggest tension-reduction techniques, such as: Music. Spend time creating or listening to music that you enjoy and that inspires  you. Mindfulness-based meditation. Practice being aware of your normal breaths while not trying to control your breathing. It can be done while sitting or walking. Centering prayer. Focus on a word, phrase, or sacred image that means something to you and brings you peace. Deep breathing. Expand your stomach and inhale slowly through your nose. Hold your breath for 3-5 seconds. Then breathe out slowly, letting your stomach muscles relax. Self-talk. Learn to notice and spot thought patterns that lead to anxiety reactions. Change those patterns to thoughts that feel peaceful. Muscle relaxation. Take time to tense muscles and then relax them. Choose a tension-reduction technique that fits your lifestyle and personality. These techniques take time and practice. Set aside 5-15 minutes a day to do them. Specialized therapists can offer counseling and training in these techniques. The training to help with anxiety may be covered by some insurance plans. Other things you can do to manage stress and anxiety include: Keeping a stress diary. This can help you learn what triggers your reaction and then learn ways to manage your response. Thinking about how you react to certain situations. You may not be able to control everything, but you can control your response. Making time for activities that help you relax and not feeling guilty about spending your time in this way. Doing visual imagery. This involves imagining or creating mental pictures to help you relax. Practicing yoga. Through yoga poses, you can lower tension and relax.  Medicines Medicines for anxiety include: Antidepressant medicines. These  are usually prescribed for long-term daily control. Anti-anxiety medicines. These may be added in severe cases, especially when panic attacks occur. When used together, medicines, psychotherapy, and tension-reduction techniques may be the most effective treatment. Relationships Relationships can play a big  part in helping you recover. Spend more time connecting with trusted friends and family members. Think about going to couples counseling if you have a partner, taking family education classes, or going to family therapy. Therapy can help you and others better understand your anxiety. How to recognize changes in your anxiety Everyone responds differently to treatment for anxiety. Recovery from anxiety happens when symptoms lessen and stop interfering with your daily life at home or work. This may mean that you will start to: Have better concentration and focus. Worry will interfere less in your daily thinking. Sleep better. Be less irritable. Have more energy. Have improved memory. Try to recognize when your condition is getting worse. Contact your provider if your symptoms interfere with home or work and you feel like your condition is not improving. Follow these instructions at home: Activity Exercise. Adults should: Exercise for at least 150 minutes each week. The exercise should increase your heart rate and make you sweat (moderate-intensity exercise). Do strengthening exercises at least twice a week. Get the right amount and quality of sleep. Most adults need 7-9 hours of sleep each night. Lifestyle  Eat a healthy diet that includes plenty of vegetables, fruits, whole grains, low-fat dairy products, and lean protein. Do not eat a lot of foods that are high in fats, added sugars, or salt (sodium). Make choices that simplify your life. Do not use any products that contain nicotine or tobacco. These products include cigarettes, chewing tobacco, and vaping devices, such as e-cigarettes. If you need help quitting, ask your provider. Avoid caffeine, alcohol, and certain over-the-counter cold medicines. These may make you feel worse. Ask your pharmacist which medicines to avoid. General instructions Take over-the-counter and prescription medicines only as told by your provider. Keep all  follow-up visits. This is to make sure you are managing your anxiety well or if you need more support. Where to find support You can get help and support from: Self-help groups. Online and [employer]. A trusted spiritual leader. Couples counseling. Family education classes. Family therapy. Where to find more information You may find that joining a support group helps you deal with your anxiety. The following sources can help you find counselors or support groups near you: Mental Health America: mentalhealthamerica.net Anxiety and Depression Association of [place] (ADAA): adaa.org The First American on Mental Illness (NAMI): nami.org Contact a health care provider if: You have a hard time staying focused or finishing tasks. You spend many hours a day feeling worried about everyday life. You are very tired because you cannot stop worrying. You start to have headaches or often feel tense. You have chronic nausea or diarrhea. Get help right away if: Your heart feels like it is racing. You have shortness of breath. You have thoughts of hurting yourself or others. Get help right away if you feel like you may hurt yourself or others, or have thoughts about taking your own life. Go to your nearest emergency room or: Call 911. Call the National Suicide Prevention Lifeline at 647-716-6718 or 988. This is open 24 hours a day. Text the Crisis Text Line at 269-813-4295. This information is not intended to replace advice given to you by your health care provider. Make sure you discuss any questions you have with your  health care provider. Document Revised: 09/04/2022 Document Reviewed: 03/19/2021 Elsevier Patient Education  2024 [employer].

## 2023-05-16 ENCOUNTER — Encounter: Payer: Self-pay | Admitting: Family Medicine

## 2023-05-23 MED ORDER — METOPROLOL SUCCINATE ER 50 MG PO TB24: 50.0000 mg | ORAL_TABLET | Freq: Every day | ORAL | 3 refills | Status: DC

## 2023-05-23 NOTE — Addendum Note (Signed)
Addended by: Eldred Manges on: 05/23/2023 01:27 PM   Modules accepted: Orders

## 2023-06-05 ENCOUNTER — Encounter: Payer: Self-pay | Admitting: Family Medicine

## 2023-06-05 MED ORDER — BUSPIRONE HCL 10 MG PO TABS: 10.0000 mg | ORAL_TABLET | Freq: Three times a day (TID) | ORAL | 3 refills | Status: DC

## 2023-06-05 NOTE — Telephone Encounter (Signed)
Reports increase to 15mg  vs 10 mg is too much and she is feeling nauseous pt requesting to return to 10mg  please advise

## 2023-06-26 ENCOUNTER — Encounter: Payer: Self-pay | Admitting: Family Medicine

## 2023-06-26 ENCOUNTER — Ambulatory Visit (INDEPENDENT_AMBULATORY_CARE_PROVIDER_SITE_OTHER): Payer: BC Managed Care – PPO | Admitting: Family Medicine

## 2023-06-26 VITALS — BP 126/70 | HR 67 | Temp 97.9°F | Ht 60.0 in | Wt 139.0 lb

## 2023-06-26 DIAGNOSIS — E781 Pure hyperglyceridemia: Secondary | ICD-10-CM | POA: Diagnosis not present

## 2023-06-26 DIAGNOSIS — F411 Generalized anxiety disorder: Secondary | ICD-10-CM

## 2023-06-26 DIAGNOSIS — I10 Essential (primary) hypertension: Secondary | ICD-10-CM | POA: Diagnosis not present

## 2023-06-26 DIAGNOSIS — F902 Attention-deficit hyperactivity disorder, combined type: Secondary | ICD-10-CM

## 2023-06-26 LAB — COMPREHENSIVE METABOLIC PANEL
ALT: 21 U/L (ref 0–35)
AST: 15 U/L (ref 0–37)
Albumin: 4.6 g/dL (ref 3.5–5.2)
Alkaline Phosphatase: 54 U/L (ref 39–117)
BUN: 18 mg/dL (ref 6–23)
CO2: 26 mEq/L (ref 19–32)
Calcium: 10.1 mg/dL (ref 8.4–10.5)
Chloride: 105 mEq/L (ref 96–112)
Creatinine, Ser: 0.89 mg/dL (ref 0.40–1.20)
GFR: 79.8 mL/min (ref >60.00)
Glucose, Bld: 106 mg/dL — ABNORMAL HIGH (ref 70–99)
Potassium: 5.1 mEq/L (ref 3.5–5.1)
Sodium: 138 mEq/L (ref 135–145)
Total Bilirubin: 0.5 mg/dL (ref 0.2–1.2)
Total Protein: 7.3 g/dL (ref 6.0–8.3)

## 2023-06-26 LAB — LIPID PANEL
Cholesterol: 219 mg/dL — ABNORMAL HIGH (ref 0–200)
HDL: 52 mg/dL (ref >39.00)
LDL Cholesterol: 139 mg/dL — ABNORMAL HIGH (ref 0–99)
NonHDL: 166.7
Total CHOL/HDL Ratio: 4
Triglycerides: 138 mg/dL (ref 0.0–149.0)
VLDL: 27.6 mg/dL (ref 0.0–40.0)

## 2023-06-26 NOTE — Progress Notes (Signed)
Subjective:  Patient ID: Ashley Gardner, female    DOB: 30-Apr-1981  Age: 42 y.o. MRN: [id number]  CC:  Chief Complaint  Patient presents with   Anxiety    Pt notes has not made an appointment with therapist she states she will call them today, notes she is starting school again on top of 2 jobs, pt notes anxiety level apx the same as last month     HPI Ashley Gardner presents for   General anxiety Disorder Discussed in June.  Associated with ADHD treated with Ritalin.  Various adjustments to medications have been attempted.  As of last visit she had been taking BuSpar 10 mg 3 times daily and Wellbutrin 200 mg twice daily.  We had increased her Ritalin to 40 mg at lunchtime and 20 mg in the morning without new side effects.  Overwhelmed with things to do in the afternoons, increased anxiety at that time with multiple responsibilities.  Some irritation, moodiness at times.  It was thought that most of her symptoms were likely due to uncontrolled anxiety, less likely ADD.  Reverted back to 20 mg twice daily dosing of Ritalin and tried higher dose of buspirone at 15 mg 3 times daily, with slow titration up towards that dose.  Referred to psychiatry to evaluate for further medication changes but continued on Wellbutrin same dose at that time.  Additionally recommended follow-up with psychology/counseling and phone number provided.  Appears she was referred to [facility]. No appointment scheduled yet - she needs to call them to schedule. Has not scheduled with therapist yet. Not interested in EAP option.  See MyChart message June 26.  Nausea with the 15 mg dose, and only able to increase to twice per day.  Decided to revert back to 10 mg 3 times daily. Making a list in the morning. Some difficulty with completing.  Still recovering from sudden passing of uncle.      06/26/2023    9:03 AM 04/15/2023    8:53 AM 10/29/2022   10:04 AM 09/05/2022    8:08 AM 04/04/2022    8:19  AM  Depression screen PHQ 2/9  Decreased Interest 1 1 0 1 1  Down, Depressed, Hopeless 1 1 0 1 1  PHQ - 2 Score 2 2 0 2 2  Altered sleeping 2 2 0 2 1  Tired, decreased energy 2 2 0 3 3  Change in appetite 1 3 0 0 1  Feeling bad or failure about yourself  1 1 0 0 1  Trouble concentrating 2 2 0 1 1  Moving slowly or fidgety/restless 1 1 0 0 0  Suicidal thoughts 0 0 0 0 0  PHQ-9 Score 11 13 0 8 9  Difficult doing work/chores  Somewhat difficult          06/26/2023    9:03 AM 04/15/2023    8:53 AM 09/05/2022    8:09 AM 05/03/2021   10:45 AM  GAD 7 : Generalized Anxiety Score  Nervous, Anxious, on Edge 1 0 1 1  Control/stop worrying 1 1 1 1   Worry too much - different things 1 1 1 1   Trouble relaxing 1 2 2 1   Restless 1 1 1 2   Easily annoyed or irritable 1 3 3 1   Afraid - awful might happen 1 0 0 0  Total GAD 7 Score 7 8 9 7   Anxiety Difficulty  Somewhat difficult      Hypertension: Borderline control  at last visit, losartan increased to 50 mg daily, continue same dose amlodipine.  Continued Toprol same dose. Home readings: 111/70-122/84. No new side effects.  Taking toprol at night - less tired.  Amlodipine and losartan in am.   On Optavia, has improved weight. Feels good about weight loss.   BP Readings from Last 3 Encounters:  06/26/23 126/70  04/15/23 130/88  10/29/22 138/80   Lab Results  Component Value Date   CREATININE 0.88 04/15/2023       History Patient Active Problem List   Diagnosis Date Noted   Hypertriglyceridemia 10/29/2022   Adult attention deficit hyperactivity disorder 10/01/2022   Gestational diabetes mellitus 10/01/2022   Hypertensive disorder 10/01/2022   ADD (attention deficit disorder) 09/2020   Family history of early CAD 09/30/2017   Elevated blood pressure reading 08/06/2017   Mixed anxiety and depressive disorder 08/06/2017   Insomnia 08/06/2017   Current mild episode of major depressive disorder without prior episode (HCC)  08/06/2017   Past Medical History:  Diagnosis Date   Anxiety    Decreased concentration, Excessive worry, insomnia, irritability, nervous/anxious behavior,obessions(food) restlessness.   Depression    Feelings of hopelessness, feeling of worthlessness   Hyperlipidemia    Hypersomnia    Palpitations    Restlessness    Past Surgical History:  Procedure Laterality Date   LEFT MASS BIOPSY     Benign appearing fragments of  bone and fibrous tissue, deferential     Allergies  Allergen Reactions   Penicillin G Hives   Penicillins Hives   Prior to Admission medications   Medication Sig Start Date End Date Taking? Authorizing Provider  amLODipine (NORVASC) 5 MG tablet Take 1 tablet (5 mg total) by mouth daily. 04/15/23  Yes Shade Flood, MD  buPROPion [facility] SR) 200 MG 12 hr tablet Take 1 tablet (200 mg total) by mouth 2 (two) times daily. TAKE 1 TABLET BY MOUTH TWICE A DAY 04/15/23  Yes Shade Flood, MD  busPIRone (BUSPAR) 10 MG tablet Take 1 tablet (10 mg total) by mouth 3 (three) times daily. 06/05/23  Yes Shade Flood, MD  fluticasone (FLONASE) 50 MCG/ACT nasal spray Place 2 sprays into both nostrils daily. 01/06/23  Yes Margaretann Loveless, PA-C  ipratropium (ATROVENT) 0.06 % nasal spray Place into both nostrils. 10/24/22  Yes [provider]  Ashley Gardner 91-Day (SEASONIQUE PO) Take 1 Dose by mouth 1 day or 1 dose.   Yes [provider]  LO LOESTRIN FE 1 MG-10 MCG / 10 MCG tablet Take 1 tablet by mouth daily. 04/07/23  Yes [provider]  losartan (COZAAR) 50 MG tablet Take 1 tablet (50 mg total) by mouth daily. 05/15/23  Yes Shade Flood, MD  methylphenidate (RITALIN) 20 MG tablet 1 tab po qam, 2 tabs po at lunch. 04/15/23  Yes Shade Flood, MD  metoprolol succinate (TOPROL-XL) 50 MG 24 hr tablet Take 1 tablet (50 mg total) by mouth daily. Take with or immediately following a meal. 05/23/23  Yes Shade Flood, MD   zonisamide (ZONEGRAN) 25 MG capsule Take 100 mg by mouth daily. 11/26/22  Yes [provider]   Social History   Socioeconomic History   Marital status: Divorced    Spouse name: Not on file   Number of children: Not on file   Years of education: Not on file   Highest education level: Some college, no degree  Occupational History   Not on file  Tobacco Use  Smoking status: Never   Smokeless tobacco: Never  Vaping Use   Vaping status: Never Used  Substance and Sexual Activity   Alcohol use: Yes    Comment: occ   Drug use: No   Sexual activity: Yes    Birth control/protection: Pill  Other Topics Concern   Not on file  Social History Narrative   Not on file   Social Determinants of Health   Financial Resource Strain: Low Risk  (04/14/2023)   Overall Financial Resource Strain (CARDIA)    Difficulty of Paying Living Expenses: Not hard at all  Food Insecurity: No Food Insecurity (04/14/2023)   Hunger Vital Sign    Worried About Running Out of Food in the Last Year: Never true    Ran Out of Food in the Last Year: Never true  Transportation Needs: No Transportation Needs (04/14/2023)   PRAPARE - Administrator, Civil Service (Medical): No    Lack of Transportation (Non-Medical): No  Physical Activity: Sufficiently Active (04/14/2023)   Exercise Vital Sign    Days of Exercise per Week: 5 days    Minutes of Exercise per Session: 60 min  Stress: Stress Concern Present (04/14/2023)   [employer] of Occupational Health - Occupational Stress Questionnaire    Feeling of Stress : Rather much  Social Connections: Socially Isolated (04/14/2023)   Social Connection and Isolation Panel [NHANES]    Frequency of Communication with Friends and Family: Once a week    Frequency of Social Gatherings with Friends and Family: Once a week    Attends Religious Services: More than 4 times per year    Active Member of [employer] or Organizations: No    Attends Hospital doctor: Not on file    Marital Status: Divorced  Intimate Partner Violence: Not on file    Review of Systems Per HPI.   Objective:   Vitals:   06/26/23 0904  BP: 126/70  Pulse: 67  Temp: 97.9 F (36.6 C)  TempSrc: Temporal  SpO2: 100%  Weight: 139 lb (63 kg)  Height: 5' (1.524 m)   Wt Readings from Last 3 Encounters:  06/26/23 139 lb (63 kg)  04/15/23 147 lb (66.7 kg)  10/29/22 147 lb 3.2 oz (66.8 kg)     Physical Exam Vitals reviewed.  Constitutional:      Appearance: Normal appearance. She is well-developed.  HENT:     Head: Normocephalic and atraumatic.  Eyes:     Conjunctiva/sclera: Conjunctivae normal.     Pupils: Pupils are equal, round, and reactive to light.  Neck:     Vascular: No carotid bruit.  Cardiovascular:     Rate and Rhythm: Normal rate and regular rhythm.     Heart sounds: Normal heart sounds.  Pulmonary:     Effort: Pulmonary effort is normal.     Breath sounds: Normal breath sounds.  Abdominal:     Palpations: Abdomen is soft. There is no pulsatile mass.     Tenderness: There is no abdominal tenderness.  Musculoskeletal:     Right lower leg: No edema.     Left lower leg: No edema.  Skin:    General: Skin is warm and dry.  Neurological:     Mental Status: She is alert and oriented to person, place, and time.  Psychiatric:        Mood and Affect: Mood normal.        Behavior: Behavior normal.        Thought  Content: Thought content normal.     Assessment & Plan:  ALIEGHA PAULLIN is a 42 y.o. female . Generalized anxiety disorder  -Unfortunately still having some difficulty at times with managing anxiety, and multiple responsibilities.  Commended on use of list in the morning, but additional recommendation to prioritize items for the day so that if she does not complete the least important items that may be less frustrating or difficult.  I still think meeting with a therapist/psychologist will be helpful and recommended she contact  them.  Also contact psychiatry for discussion of medications.  Potentially may benefit from additional medication.  Changes deferred for now.  Continue Wellbutrin, BuSpar same doses.  Hypertension, unspecified type  -Improved control with current regimen, continue same.  Updated creatinine on higher dose ARB.  Attention deficit hyperactivity disorder (ADHD), combined type  -Overall stable with current dose of Ritalin, continue same for now.  Hypertriglyceridemia - Plan: Comprehensive metabolic panel, Lipid panel  -Commended on weight loss.  Anticipate improved lipids.  Likely contributing to improved hypertension control as above.  Requested updated labs, check CMP, lipid panel  No orders of the defined types were placed in this encounter.  Patient Instructions  Call psychiatry for appointment as well as psychology.  I think meeting with a therapist may be helpful in reviewing other ways of managing your day, and the anxiety symptoms.  Making a list at the start of day is a great idea, try the prioritizing approach we discussed. no medication changes for now.  Blood pressure looks better.  Congratulations on the weight loss!  Keep up the good work!     Signed,   Meredith Staggers, MD [facility], [facility] 06/26/23 9:52 AM

## 2023-06-26 NOTE — Patient Instructions (Signed)
Call psychiatry for appointment as well as psychology.  I think meeting with a therapist may be helpful in reviewing other ways of managing your day, and the anxiety symptoms.  Making a list at the start of day is a great idea, try the prioritizing approach we discussed. no medication changes for now.  Blood pressure looks better.  Congratulations on the weight loss!  Keep up the good work!

## 2023-07-09 ENCOUNTER — Encounter: Payer: Self-pay | Admitting: Family Medicine

## 2023-07-10 ENCOUNTER — Telehealth: Payer: Self-pay | Admitting: Family Medicine

## 2023-07-10 NOTE — Telephone Encounter (Signed)
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 She took 2 Benadryl within 30 mins of each other starting to help (allergic to PCN) -advise

## 2023-07-10 NOTE — Telephone Encounter (Signed)
Called patient based on symptoms of possible allergic reaction.  Some itching, no hives. Took benadryl and itching improved. No dyspnea, oral or throat symptoms. No hives. Feling ok now. No further treatment at this time. ER/urgent care precautions given.

## 2023-07-10 NOTE — Telephone Encounter (Signed)
Anything to advise about exposure to mold

## 2023-07-25 DIAGNOSIS — M79605 Pain in left leg: Secondary | ICD-10-CM | POA: Diagnosis not present

## 2023-07-25 DIAGNOSIS — S91322A Laceration with foreign body, left foot, initial encounter: Secondary | ICD-10-CM | POA: Diagnosis not present

## 2023-07-26 ENCOUNTER — Encounter: Payer: Self-pay | Admitting: Family Medicine

## 2023-07-26 NOTE — Telephone Encounter (Signed)
Pt is unsure what dose she should be taking she notes pharmacist reported 25 mg but pt notes she has been taking the 50 mg dose how should she continue?

## 2023-08-07 ENCOUNTER — Other Ambulatory Visit: Payer: Self-pay | Admitting: Family Medicine

## 2023-08-07 DIAGNOSIS — F902 Attention-deficit hyperactivity disorder, combined type: Secondary | ICD-10-CM

## 2023-08-07 MED ORDER — METHYLPHENIDATE HCL 20 MG PO TABS: ORAL_TABLET | ORAL | 0 refills | Status: DC

## 2023-08-07 NOTE — Telephone Encounter (Signed)
Ritalin 10 mg Requested Prescriptions   Pending Prescriptions Disp Refills   methylphenidate (RITALIN) 20 MG tablet 90 tablet 0    Sig: 1 tab po qam, 2 tabs po at lunch.     Date of patient request: 08/07/23 Last office visit: 06/26/23 Date of last refill: 04/15/23 Last refill amount: 90 Follow up time period per chart: 6 months

## 2023-08-07 NOTE — Telephone Encounter (Signed)
Controlled substance database reviewed.  #90 methylphenidate filled on 04/15/2023.  Recent office visit in July.  Refill ordered.

## 2023-09-02 ENCOUNTER — Encounter: Payer: Self-pay | Admitting: Family Medicine

## 2023-09-02 DIAGNOSIS — F39 Unspecified mood [affective] disorder: Secondary | ICD-10-CM | POA: Diagnosis not present

## 2023-09-02 DIAGNOSIS — F901 Attention-deficit hyperactivity disorder, predominantly hyperactive type: Secondary | ICD-10-CM | POA: Diagnosis not present

## 2023-09-02 DIAGNOSIS — F411 Generalized anxiety disorder: Secondary | ICD-10-CM | POA: Diagnosis not present

## 2023-09-05 DIAGNOSIS — Z1231 Encounter for screening mammogram for malignant neoplasm of breast: Secondary | ICD-10-CM | POA: Diagnosis not present

## 2023-09-05 DIAGNOSIS — Z01419 Encounter for gynecological examination (general) (routine) without abnormal findings: Secondary | ICD-10-CM | POA: Diagnosis not present

## 2023-09-05 DIAGNOSIS — Z6829 Body mass index (BMI) 29.0-29.9, adult: Secondary | ICD-10-CM | POA: Diagnosis not present

## 2023-09-10 DIAGNOSIS — F901 Attention-deficit hyperactivity disorder, predominantly hyperactive type: Secondary | ICD-10-CM | POA: Diagnosis not present

## 2023-09-10 DIAGNOSIS — F411 Generalized anxiety disorder: Secondary | ICD-10-CM | POA: Diagnosis not present

## 2023-09-10 DIAGNOSIS — F39 Unspecified mood [affective] disorder: Secondary | ICD-10-CM | POA: Diagnosis not present

## 2023-09-12 DIAGNOSIS — R21 Rash and other nonspecific skin eruption: Secondary | ICD-10-CM | POA: Diagnosis not present

## 2023-09-25 DIAGNOSIS — F901 Attention-deficit hyperactivity disorder, predominantly hyperactive type: Secondary | ICD-10-CM | POA: Diagnosis not present

## 2023-09-25 DIAGNOSIS — F39 Unspecified mood [affective] disorder: Secondary | ICD-10-CM | POA: Diagnosis not present

## 2023-09-25 DIAGNOSIS — F411 Generalized anxiety disorder: Secondary | ICD-10-CM | POA: Diagnosis not present

## 2023-10-01 DIAGNOSIS — F39 Unspecified mood [affective] disorder: Secondary | ICD-10-CM | POA: Diagnosis not present

## 2023-10-01 DIAGNOSIS — F411 Generalized anxiety disorder: Secondary | ICD-10-CM | POA: Diagnosis not present

## 2023-10-01 DIAGNOSIS — F901 Attention-deficit hyperactivity disorder, predominantly hyperactive type: Secondary | ICD-10-CM | POA: Diagnosis not present

## 2023-10-08 DIAGNOSIS — F411 Generalized anxiety disorder: Secondary | ICD-10-CM | POA: Diagnosis not present

## 2023-10-09 ENCOUNTER — Encounter: Payer: Self-pay | Admitting: Family Medicine

## 2023-10-28 DIAGNOSIS — F39 Unspecified mood [affective] disorder: Secondary | ICD-10-CM | POA: Diagnosis not present

## 2023-10-28 DIAGNOSIS — F901 Attention-deficit hyperactivity disorder, predominantly hyperactive type: Secondary | ICD-10-CM | POA: Diagnosis not present

## 2023-10-28 DIAGNOSIS — F411 Generalized anxiety disorder: Secondary | ICD-10-CM | POA: Diagnosis not present

## 2023-10-28 DIAGNOSIS — Z1331 Encounter for screening for depression: Secondary | ICD-10-CM | POA: Diagnosis not present

## 2023-11-14 ENCOUNTER — Other Ambulatory Visit: Payer: Self-pay | Admitting: Family Medicine

## 2023-11-14 DIAGNOSIS — I10 Essential (primary) hypertension: Secondary | ICD-10-CM

## 2023-11-15 DIAGNOSIS — J40 Bronchitis, not specified as acute or chronic: Secondary | ICD-10-CM | POA: Diagnosis not present

## 2023-11-24 DIAGNOSIS — J45901 Unspecified asthma with (acute) exacerbation: Secondary | ICD-10-CM | POA: Diagnosis not present

## 2023-11-24 DIAGNOSIS — R059 Cough, unspecified: Secondary | ICD-10-CM | POA: Diagnosis not present

## 2023-11-26 DIAGNOSIS — F411 Generalized anxiety disorder: Secondary | ICD-10-CM | POA: Diagnosis not present

## 2023-11-26 DIAGNOSIS — F901 Attention-deficit hyperactivity disorder, predominantly hyperactive type: Secondary | ICD-10-CM | POA: Diagnosis not present

## 2023-12-17 DIAGNOSIS — F411 Generalized anxiety disorder: Secondary | ICD-10-CM | POA: Diagnosis not present

## 2023-12-17 DIAGNOSIS — F901 Attention-deficit hyperactivity disorder, predominantly hyperactive type: Secondary | ICD-10-CM | POA: Diagnosis not present

## 2023-12-24 DIAGNOSIS — F901 Attention-deficit hyperactivity disorder, predominantly hyperactive type: Secondary | ICD-10-CM | POA: Diagnosis not present

## 2023-12-24 DIAGNOSIS — F411 Generalized anxiety disorder: Secondary | ICD-10-CM | POA: Diagnosis not present

## 2023-12-26 ENCOUNTER — Other Ambulatory Visit: Payer: Self-pay | Admitting: Family Medicine

## 2023-12-26 DIAGNOSIS — I10 Essential (primary) hypertension: Secondary | ICD-10-CM

## 2023-12-30 ENCOUNTER — Ambulatory Visit: Payer: BC Managed Care – PPO | Admitting: Family Medicine

## 2023-12-31 DIAGNOSIS — F901 Attention-deficit hyperactivity disorder, predominantly hyperactive type: Secondary | ICD-10-CM | POA: Diagnosis not present

## 2023-12-31 DIAGNOSIS — F411 Generalized anxiety disorder: Secondary | ICD-10-CM | POA: Diagnosis not present

## 2024-01-01 ENCOUNTER — Other Ambulatory Visit: Payer: Self-pay | Admitting: Family Medicine

## 2024-01-01 DIAGNOSIS — F418 Other specified anxiety disorders: Secondary | ICD-10-CM

## 2024-01-07 DIAGNOSIS — R5383 Other fatigue: Secondary | ICD-10-CM | POA: Diagnosis not present

## 2024-01-07 DIAGNOSIS — F901 Attention-deficit hyperactivity disorder, predominantly hyperactive type: Secondary | ICD-10-CM | POA: Diagnosis not present

## 2024-01-07 DIAGNOSIS — R635 Abnormal weight gain: Secondary | ICD-10-CM | POA: Diagnosis not present

## 2024-01-07 DIAGNOSIS — F411 Generalized anxiety disorder: Secondary | ICD-10-CM | POA: Diagnosis not present

## 2024-01-07 DIAGNOSIS — F4310 Post-traumatic stress disorder, unspecified: Secondary | ICD-10-CM | POA: Diagnosis not present

## 2024-01-08 ENCOUNTER — Ambulatory Visit: Payer: BC Managed Care – PPO | Admitting: Family Medicine

## 2024-01-08 VITALS — BP 116/60 | HR 73 | Temp 98.5°F | Ht 60.0 in | Wt 159.2 lb

## 2024-01-08 DIAGNOSIS — F4310 Post-traumatic stress disorder, unspecified: Secondary | ICD-10-CM | POA: Diagnosis not present

## 2024-01-08 DIAGNOSIS — F39 Unspecified mood [affective] disorder: Secondary | ICD-10-CM | POA: Diagnosis not present

## 2024-01-08 DIAGNOSIS — F411 Generalized anxiety disorder: Secondary | ICD-10-CM | POA: Diagnosis not present

## 2024-01-08 DIAGNOSIS — L299 Pruritus, unspecified: Secondary | ICD-10-CM | POA: Diagnosis not present

## 2024-01-08 DIAGNOSIS — I10 Essential (primary) hypertension: Secondary | ICD-10-CM

## 2024-01-08 DIAGNOSIS — F902 Attention-deficit hyperactivity disorder, combined type: Secondary | ICD-10-CM

## 2024-01-08 DIAGNOSIS — S90519A Abrasion, unspecified ankle, initial encounter: Secondary | ICD-10-CM

## 2024-01-08 DIAGNOSIS — F901 Attention-deficit hyperactivity disorder, predominantly hyperactive type: Secondary | ICD-10-CM | POA: Diagnosis not present

## 2024-01-08 NOTE — Patient Instructions (Addendum)
Try over-the-counter Aveeno or Sarna lotion to help with ankle itching if needed.  Can also try over-the-counter hydrocortisone steroid cream a few times per day if needed or just in the evening prior to the itching started to see if that helps.  If that is not improving at your next visit we can certainly look at other causes.  Sounds like your specialist has a good plan on looking into other causes for the sleepiness and your symptoms.  I will keep an eye out for any results and recommendations from them.  Blood pressure looks good today.  I do not recommend any medication changes at this time.  Depending on the lab work obtained by your specialist, we can always check other labs but I will hold on that today.  Follow-up to discuss back and toe symptoms further.  If any new or acute worsening symptoms I am happy to see you sooner.  Thanks for coming in today.

## 2024-01-08 NOTE — Progress Notes (Unsigned)
Subjective:  Patient ID: Ashley Gardner, female    DOB: 02/24/1981  Age: 43 y.o. MRN: [id number]  CC:  Chief Complaint  Patient presents with   Medical Management of Chronic Issues    Pt wanted [place] to know she has been seeing a counselor and a Dr for depression and anxiety, no other concerns today     HPI Ashley Gardner presents for   Generalized anxiety disorder With history of ADHD Last discussed in July.  Various adjustments of medications have been tried at that time.  Appeared to be more uncontrolled anxiety and ADD, adjusted buspirone dosing and back to 20 mg twice daily of Ritalin, referred to psychiatry previously.  Some intolerance to higher dosing of buspirone, had been back at the 10 mg 3 times daily dosing at July visit.  We discussed meeting with a therapist as well as following up with psychiatry decide on medication changes.  She is continued on Wellbutrin, BuSpar same doses as well as her Ritalin.  Meeting with therapist once per week- Ashley Gardner, through [employer] Psychiatrist Ashley Gardner - visit earlier.   Now off wellbutrin, on lamictal. Still on buspar 10mg  tid.  No med changes today. Looking in to other possible causes of somnolence, irritability.  Saw GYN yesterday to look into PCOS, thyroid, hormones.  Also being evaluated for sleep disorder - referred to [facility].   Now on Concerta 54mg  tbcr. Increased somnolence with Relexxii Not needing hydroxyzine.  No SI/HI.     01/08/2024    2:47 PM 06/26/2023    9:03 AM 04/15/2023    8:53 AM 10/29/2022   10:04 AM 09/05/2022    8:08 AM  Depression screen PHQ 2/9  Decreased Interest 2 1 1  0 1  Down, Depressed, Hopeless 2 1 1  0 1  PHQ - 2 Score 4 2 2  0 2  Altered sleeping 3 2 2  0 2  Tired, decreased energy 3 2 2  0 3  Change in appetite 3 1 3  0 0  Feeling bad or failure about yourself  2 1 1  0 0  Trouble concentrating 3 2 2  0 1  Moving slowly or fidgety/restless 2 1 1  0 0  Suicidal thoughts 0 0 0 0 0   PHQ-9 Score 20 11 13  0 8  Difficult doing work/chores Very difficult  Somewhat difficult        01/08/2024    2:47 PM 06/26/2023    9:03 AM 04/15/2023    8:53 AM 09/05/2022    8:09 AM  GAD 7 : Generalized Anxiety Score  Nervous, Anxious, on Edge 1 1 0 1  Control/stop worrying 1 1 1 1   Worry too much - different things 2 1 1 1   Trouble relaxing 3 1 2 2   Restless 3 1 1 1   Easily annoyed or irritable 3 1 3 3   Afraid - awful might happen 0 1 0 0  Total GAD 7 Score 13 7 8 9   Anxiety Difficulty Very difficult  Somewhat difficult     Hypertension: Improved control at her July visit, continued on losartan 50 mg daily, amlodipine 5 mg daily, Toprol-XL 50 mg daily. She was on Optavia at last visit and weight was improving. Home readings: 143/93 at visit earlier today, was not rechecked - stressful time prior to BP. No recent home readings. No new side effects.  BP Readings from Last 3 Encounters:  01/08/24 116/60  06/26/23 126/70  04/15/23 130/88   Lab Results  Component  Value Date   CREATININE 0.89 06/26/2023   Wt Readings from Last 3 Encounters:  01/08/24 159 lb 3.2 oz (72.2 kg)  06/26/23 139 lb (63 kg)  04/15/23 147 lb (66.7 kg)    Hyperlipidemia: As above, weight loss at her July visit with improved LDL, normal triglycerides at that time. Defers repeat labs for now with some weight gain. Has started walking more, but somnolence limiting exercise as above.  The 10-year ASCVD risk score (Arnett DK, et al., 2019) is: 1%   Values used to calculate the score:     Age: 7 years     Sex: Female     Is Non-Hispanic African American: No     Diabetic: No     Tobacco smoker: No     Systolic Blood Pressure: 116 mmHg     Is BP treated: Yes     HDL Cholesterol: 52 mg/dL     Total Cholesterol: 219 mg/dL  Lab Results  Component Value Date   CHOL 219 (H) 06/26/2023   HDL 52.00 06/26/2023   LDLCALC 139 (H) 06/26/2023   LDLDIRECT 199.0 04/15/2023   TRIG 138.0 06/26/2023   CHOLHDL 4  06/26/2023   Lab Results  Component Value Date   ALT 21 06/26/2023   AST 15 06/26/2023   ALKPHOS 54 06/26/2023   BILITOT 0.5 06/26/2023   Ankle itching Past week or 2.  Only notices at bedtime.  No other itching on body, no rash.  Has some abrasions from scratching ankles at bedtime.  Has tried some lotions over-the-counter.  At end of visit, also has concerns regarding some ongoing back pain and some dysesthesias in toes,.  She plans separate visit to discuss the symptoms further and possible referral.  History Patient Active Problem List   Diagnosis Date Noted   Hypertriglyceridemia 10/29/2022   Adult attention deficit hyperactivity disorder 10/01/2022   Gestational diabetes mellitus 10/01/2022   Hypertensive disorder 10/01/2022   ADD (attention deficit disorder) 09/2020   Family history of early CAD 09/30/2017   Elevated blood pressure reading 08/06/2017   Mixed anxiety and depressive disorder 08/06/2017   Insomnia 08/06/2017   Current mild episode of major depressive disorder without prior episode (HCC) 08/06/2017   Past Medical History:  Diagnosis Date   Anxiety    Decreased concentration, Excessive worry, insomnia, irritability, nervous/anxious behavior,obessions(food) restlessness.   Depression    Feelings of hopelessness, feeling of worthlessness   Hyperlipidemia    Hypersomnia    Palpitations    Restlessness    Past Surgical History:  Procedure Laterality Date   LEFT MASS BIOPSY     Benign appearing fragments of  bone and fibrous tissue, deferential     Allergies  Allergen Reactions   Penicillin G Hives   Penicillins Hives   Prior to Admission medications   Medication Sig Start Date End Date Taking? Authorizing Provider  amLODipine (NORVASC) 5 MG tablet TAKE 1 TABLET (5 MG TOTAL) BY MOUTH DAILY. 11/14/23  Yes Shade Flood, MD  fluticasone (FLONASE) 50 MCG/ACT nasal spray Place 2 sprays into both nostrils daily. 01/06/23  Yes Margaretann Loveless, PA-C   hydrOXYzine (ATARAX) 25 MG tablet Take 25 mg by mouth at bedtime.   Yes [provider]  ipratropium (ATROVENT) 0.06 % nasal spray Place into both nostrils. 10/24/22  Yes [provider]  LO LOESTRIN FE 1 MG-10 MCG / 10 MCG tablet Take 1 tablet by mouth daily. 04/07/23  Yes [provider]  losartan (COZAAR) 50  MG tablet TAKE 1 TABLET BY MOUTH EVERY DAY 12/26/23  Yes Shade Flood, MD  metoprolol succinate (TOPROL-XL) 50 MG 24 hr tablet TAKE 1 TABLET BY MOUTH DAILY. TAKE WITH OR IMMEDIATELY FOLLOWING A MEAL. 11/14/23  Yes Shade Flood, MD  buPROPion (WELLBUTRIN SR) 200 MG 12 hr tablet TAKE 1 TABLET (200 MG TOTAL) BY MOUTH 2 (TWO) TIMES DAILY. TAKE 1 TABLET BY MOUTH TWICE A DAY 01/01/24   Shade Flood, MD  lamoTRIgine (LAMICTAL) 25 MG tablet Take 25 mg by mouth daily.    [provider]  lamoTRIgine 50 MG TBDP Take by mouth.    [provider]  Terri Piedra 91-Day (SEASONIQUE PO) Take 1 Dose by mouth 1 day or 1 dose.    [provider]  methylphenidate (RELEXXII) 54 MG PO CR tablet Take 54 mg by mouth every morning.    [provider]  zonisamide (ZONEGRAN) 25 MG capsule Take 100 mg by mouth daily. 11/26/22   [provider]   Social History   Socioeconomic History   Marital status: Divorced    Spouse name: Not on file   Number of children: Not on file   Years of education: Not on file   Highest education level: Some college, no degree  Occupational History   Not on file  Tobacco Use   Smoking status: Never   Smokeless tobacco: Never  Vaping Use   Vaping status: Never Used  Substance and Sexual Activity   Alcohol use: Yes    Comment: occ   Drug use: No   Sexual activity: Yes    Birth control/protection: Pill  Other Topics Concern   Not on file  Social History Narrative   Not on file   Social Drivers of Health   Financial Resource Strain: Low Risk  (01/04/2024)   Overall Financial  Resource Strain (CARDIA)    Difficulty of Paying Living Expenses: Not hard at all  Food Insecurity: No Food Insecurity (01/04/2024)   Hunger Vital Sign    Worried About Running Out of Food in the Last Year: Never true    Ran Out of Food in the Last Year: Never true  Transportation Needs: No Transportation Needs (01/04/2024)   PRAPARE - Administrator, Civil Service (Medical): No    Lack of Transportation (Non-Medical): No  Physical Activity: Insufficiently Active (01/04/2024)   Exercise Vital Sign    Days of Exercise per Week: 4 days    Minutes of Exercise per Session: 30 min  Stress: Stress Concern Present (01/04/2024)   [employer] of Occupational Health - Occupational Stress Questionnaire    Feeling of Stress : Very much  Social Connections: Moderately Integrated (01/04/2024)   Social Connection and Isolation Panel [NHANES]    Frequency of Communication with Friends and Family: Twice a week    Frequency of Social Gatherings with Friends and Family: Once a week    Attends Religious Services: More than 4 times per year    Active Member of [employer] or Organizations: Yes    Attends Engineer, structural: More than 4 times per year    Marital Status: Divorced  Catering manager Violence: Not on file    Review of Systems  Constitutional:  Negative for fatigue.  Respiratory:  Negative for chest tightness and shortness of breath.   Cardiovascular:  Negative for chest pain, palpitations and leg swelling.  Gastrointestinal:  Negative for blood in stool.  Neurological:  Positive for headaches (no new  HA - chronic mild. no changes.). Negative for dizziness, syncope and light-headedness.  Itching of ankles at bedtime, no other areas. Scratched some areas.    Objective:   Vitals:   01/08/24 1443  BP: 116/60  Pulse: 73  Temp: 98.5 F (36.9 C)  TempSrc: Temporal  SpO2: 100%  Weight: 159 lb 3.2 oz (72.2 kg)  Height: 5' (1.524 m)     Physical Exam Vitals  reviewed.  Constitutional:      Appearance: Normal appearance. She is well-developed.  HENT:     Head: Normocephalic and atraumatic.  Eyes:     Conjunctiva/sclera: Conjunctivae normal.     Pupils: Pupils are equal, round, and reactive to light.  Neck:     Vascular: No carotid bruit.  Cardiovascular:     Rate and Rhythm: Normal rate and regular rhythm.     Heart sounds: Normal heart sounds.  Pulmonary:     Effort: Pulmonary effort is normal.     Breath sounds: Normal breath sounds.  Abdominal:     Palpations: Abdomen is soft. There is no pulsatile mass.     Tenderness: There is no abdominal tenderness.  Musculoskeletal:     Right lower leg: No edema.     Left lower leg: No edema.  Skin:    General: Skin is warm and dry.     Comments: Excoriated areas on bilateral ankles without underlying rash appreciated.  No sign of surrounding erythema or infection, see photos.  Neurological:     Mental Status: She is alert and oriented to person, place, and time.  Psychiatric:        Mood and Affect: Mood normal.        Behavior: Behavior normal.      Assessment & Plan:  GUDELIA EUGENE is a 44 y.o. female . Generalized anxiety disorder  Pruritus  Abrasion of ankle, unspecified laterality, initial encounter  Hypertension, unspecified type  Attention deficit hyperactivity disorder (ADHD), combined type   No orders of the defined types were placed in this encounter.  Patient Instructions  Try over-the-counter Aveeno or Sarna lotion to help with ankle itching if needed.  Can also try over-the-counter hydrocortisone steroid cream a few times per day if needed or just in the evening prior to the itching started to see if that helps.  If that is not improving at your next visit we can certainly look at other causes.  Sounds like your specialist has a good plan on looking into other causes for the sleepiness and your symptoms.  I will keep an eye out for any results and recommendations  from them.  Blood pressure looks good today.  I do not recommend any medication changes at this time.  Depending on the lab work obtained by your specialist, we can always check other labs but I will hold on that today.  Follow-up to discuss back and toe symptoms further.  If any new or acute worsening symptoms I am happy to see you sooner.  Thanks for coming in today.    Signed,   Meredith Staggers, MD [facility], [facility] 01/08/24 3:32 PM

## 2024-01-09 ENCOUNTER — Encounter: Payer: Self-pay | Admitting: Family Medicine

## 2024-01-21 DIAGNOSIS — F411 Generalized anxiety disorder: Secondary | ICD-10-CM | POA: Diagnosis not present

## 2024-01-21 DIAGNOSIS — F4310 Post-traumatic stress disorder, unspecified: Secondary | ICD-10-CM | POA: Diagnosis not present

## 2024-01-21 DIAGNOSIS — F901 Attention-deficit hyperactivity disorder, predominantly hyperactive type: Secondary | ICD-10-CM | POA: Diagnosis not present

## 2024-01-23 ENCOUNTER — Ambulatory Visit: Payer: BC Managed Care – PPO | Admitting: Family Medicine

## 2024-01-28 DIAGNOSIS — F4310 Post-traumatic stress disorder, unspecified: Secondary | ICD-10-CM | POA: Diagnosis not present

## 2024-01-28 DIAGNOSIS — F411 Generalized anxiety disorder: Secondary | ICD-10-CM | POA: Diagnosis not present

## 2024-01-28 DIAGNOSIS — F901 Attention-deficit hyperactivity disorder, predominantly hyperactive type: Secondary | ICD-10-CM | POA: Diagnosis not present

## 2024-01-31 ENCOUNTER — Encounter: Payer: Self-pay | Admitting: Family Medicine

## 2024-02-03 ENCOUNTER — Telehealth: Payer: Self-pay | Admitting: Diagnostic Neuroimaging

## 2024-02-03 NOTE — Telephone Encounter (Signed)
 Received sleep referral from [facility]. Placed in sleep referrals box

## 2024-02-04 DIAGNOSIS — F411 Generalized anxiety disorder: Secondary | ICD-10-CM | POA: Diagnosis not present

## 2024-02-04 DIAGNOSIS — F901 Attention-deficit hyperactivity disorder, predominantly hyperactive type: Secondary | ICD-10-CM | POA: Diagnosis not present

## 2024-02-04 DIAGNOSIS — F39 Unspecified mood [affective] disorder: Secondary | ICD-10-CM | POA: Diagnosis not present

## 2024-02-04 DIAGNOSIS — F4310 Post-traumatic stress disorder, unspecified: Secondary | ICD-10-CM | POA: Diagnosis not present

## 2024-02-09 ENCOUNTER — Other Ambulatory Visit: Payer: Self-pay | Admitting: Family Medicine

## 2024-02-09 DIAGNOSIS — F411 Generalized anxiety disorder: Secondary | ICD-10-CM

## 2024-02-10 NOTE — Telephone Encounter (Signed)
 Called patient to clarify prescriber, left VM to return call

## 2024-02-11 NOTE — Telephone Encounter (Signed)
 Copied from CRM [id number]. Topic: General - Other >> Feb 10, 2024  4:16 PM Denese Killings wrote: Reason for CRM: Patient is requesting a callback from nurse because she has questions. She stated that her Emergency contact was called earlier to confirm her phone number and other things.

## 2024-02-12 DIAGNOSIS — F901 Attention-deficit hyperactivity disorder, predominantly hyperactive type: Secondary | ICD-10-CM | POA: Diagnosis not present

## 2024-02-12 DIAGNOSIS — F411 Generalized anxiety disorder: Secondary | ICD-10-CM | POA: Diagnosis not present

## 2024-02-12 DIAGNOSIS — F4310 Post-traumatic stress disorder, unspecified: Secondary | ICD-10-CM | POA: Diagnosis not present

## 2024-02-17 DIAGNOSIS — F411 Generalized anxiety disorder: Secondary | ICD-10-CM | POA: Diagnosis not present

## 2024-02-17 DIAGNOSIS — F4310 Post-traumatic stress disorder, unspecified: Secondary | ICD-10-CM | POA: Diagnosis not present

## 2024-02-17 DIAGNOSIS — J069 Acute upper respiratory infection, unspecified: Secondary | ICD-10-CM | POA: Diagnosis not present

## 2024-02-17 DIAGNOSIS — F901 Attention-deficit hyperactivity disorder, predominantly hyperactive type: Secondary | ICD-10-CM | POA: Diagnosis not present

## 2024-02-17 DIAGNOSIS — Z20822 Contact with and (suspected) exposure to covid-19: Secondary | ICD-10-CM | POA: Diagnosis not present

## 2024-02-17 DIAGNOSIS — F39 Unspecified mood [affective] disorder: Secondary | ICD-10-CM | POA: Diagnosis not present

## 2024-02-17 DIAGNOSIS — R059 Cough, unspecified: Secondary | ICD-10-CM | POA: Diagnosis not present

## 2024-02-24 DIAGNOSIS — R778 Other specified abnormalities of plasma proteins: Secondary | ICD-10-CM | POA: Diagnosis not present

## 2024-02-25 ENCOUNTER — Ambulatory Visit (INDEPENDENT_AMBULATORY_CARE_PROVIDER_SITE_OTHER): Admitting: Neurology

## 2024-02-25 VITALS — BP 138/87 | HR 92 | Ht 60.0 in | Wt 158.0 lb

## 2024-02-25 DIAGNOSIS — Z9189 Other specified personal risk factors, not elsewhere classified: Secondary | ICD-10-CM | POA: Diagnosis not present

## 2024-02-25 DIAGNOSIS — G4719 Other hypersomnia: Secondary | ICD-10-CM | POA: Diagnosis not present

## 2024-02-25 DIAGNOSIS — R400 Somnolence: Secondary | ICD-10-CM

## 2024-02-25 DIAGNOSIS — E66811 Obesity, class 1: Secondary | ICD-10-CM | POA: Diagnosis not present

## 2024-02-25 DIAGNOSIS — R519 Headache, unspecified: Secondary | ICD-10-CM

## 2024-02-25 NOTE — Patient Instructions (Signed)

## 2024-02-25 NOTE — Progress Notes (Signed)
 Subjective:    Patient ID: Ashley Gardner is a 43 y.o. female.  HPI    Huston Foley, MD, PhD [facility] [address], [address] [address] [place], [place] [address]  Dear Armando Reichert,  I saw your patient, Ashley Gardner, upon your kind request in my sleep clinic today for initial consultation of her sleep disorder, in particular, concern for underlying obstructive sleep apnea.  The patient is unaccompanied today.  As you know, Ashley Gardner is a 43 year old female with an underlying medical history ofir hyperlipidemia, palpitations, hepatic steatosis, ADHD, anxiety, depression, and borderline obesity, who excessive daytime somnolence for the past 4 or 5 months.  She reports a longstanding history of difficulty initiating and maintaining sleep but not so lately.  Her Epworth sleepiness score is 17 out of 24, fatigue severity score 56 out of 63.  She is not sure if she snores.  She works from home but once a week has a 35-minute commute.  She has been falling asleep when sedentary without realizing that she is falling asleep.  She has a tendency to look at her phone in the middle of the night and put it back down.  She uses her phone alarm to wake up in the mornings.  She has sent text messages in the middle of the night not realizing or remembering in the mornings that she sent those text messages.  She does not take any over-the-counter sleep aids currently.  She does not typically recall dreaming.  She does not typically have vivid dreams.  She falls asleep quickly.  She has trouble waking up in the mornings and pushes the snooze button multiple times.  Bedtime is generally around 10 PM and rise time around 6.  She does not drive long distances and typically has to drive within 5 minutes of her home such as to her child school or her church for example.  I reviewed your office notes from 09/02/2023, 10/01/2023, 10/28/2023, and 01/08/2024. Of note, she has been on multiple psychotropic  medications including lamotrigine, bupropion, buspirone, methylphenidate, and hydroxyzine.  Of note, she is no longer on Wellbutrin and currently has not been on hydroxyzine for the past about 4 months.  Her Lamictal dose was increased from 100 mg daily to 150 mg daily.  She takes it at.  She has been on long-acting metoprolol 50 mg at bedtime.  She has occasional morning headaches.  She denies nightly nocturia.  She used to see a headache doctor and received trigger point injections in the past.  Her weight has been fluctuating but in the past 6 months she has been stable.  She denies telltale symptoms of restless leg syndrome. She does not wake up gasping for air.  She denies hypnagogic or hypnopompic hallucinations and sleep paralysis episodes. She has never had a sleep study. She reports that she had extensive blood work.  Most of the blood work was through her OB/GYN recently.  In her epic chart there is no recent blood work after July 2024.  Her Past Medical History Is Significant For: Past Medical History:  Diagnosis Date   Anxiety    Decreased concentration, Excessive worry, insomnia, irritability, nervous/anxious behavior,obessions(food) restlessness.   Depression    Feelings of hopelessness, feeling of worthlessness   Hyperlipidemia    Hypersomnia    Palpitations    Restlessness     Her Past Surgical History Is Significant For: Past Surgical History:  Procedure Laterality Date   LEFT MASS BIOPSY  Benign appearing fragments of  bone and fibrous tissue, deferential      Her Family History Is Significant For: Family History  Problem Relation Age of Onset   Bipolar disorder Mother    COPD Mother    Depression Mother    Diabetes Mother    Heart attack Mother    Hypertension Mother    Heart attack Father    Hyperlipidemia Father    Hypertension Father    Stroke Father     Her Social History Is Significant For: Social History   Socioeconomic History   Marital status:  Divorced    Spouse name: Not on file   Number of children: Not on file   Years of education: Not on file   Highest education level: Some college, no degree  Occupational History   Not on file  Tobacco Use   Smoking status: Never   Smokeless tobacco: Never  Vaping Use   Vaping status: Never Used  Substance and Sexual Activity   Alcohol use: Yes    Comment: occ   Drug use: No   Sexual activity: Yes    Birth control/protection: Pill  Other Topics Concern   Not on file  Social History Narrative   Not on file   Social Drivers of Health   Financial Resource Strain: Low Risk  (01/04/2024)   Overall Financial Resource Strain (CARDIA)    Difficulty of Paying Living Expenses: Not hard at all  Food Insecurity: No Food Insecurity (01/04/2024)   Hunger Vital Sign    Worried About Running Out of Food in the Last Year: Never true    Ran Out of Food in the Last Year: Never true  Transportation Needs: No Transportation Needs (01/04/2024)   PRAPARE - Administrator, Civil Service (Medical): No    Lack of Transportation (Non-Medical): No  Physical Activity: Insufficiently Active (01/04/2024)   Exercise Vital Sign    Days of Exercise per Week: 4 days    Minutes of Exercise per Session: 30 min  Stress: Stress Concern Present (01/04/2024)   [employer] of Occupational Health - Occupational Stress Questionnaire    Feeling of Stress : Very much  Social Connections: Moderately Integrated (01/04/2024)   Social Connection and Isolation Panel [NHANES]    Frequency of Communication with Friends and Family: Twice a week    Frequency of Social Gatherings with Friends and Family: Once a week    Attends Religious Services: More than 4 times per year    Active Member of [employer] or Organizations: Yes    Attends Engineer, structural: More than 4 times per year    Marital Status: Divorced    Her Allergies Are:  Allergies  Allergen Reactions   Penicillin G Hives   Penicillins  Hives  :   Her Current Medications Are:  Outpatient Encounter Medications as of 02/25/2024  Medication Sig   amLODipine (NORVASC) 5 MG tablet TAKE 1 TABLET (5 MG TOTAL) BY MOUTH DAILY.   busPIRone (BUSPAR) 10 MG tablet Take 10 mg by mouth 3 (three) times daily.   fluticasone (FLONASE) 50 MCG/ACT nasal spray Place 2 sprays into both nostrils daily.   hydrOXYzine (ATARAX) 25 MG tablet Take 25 mg by mouth at bedtime.   ipratropium (ATROVENT) 0.06 % nasal spray Place into both nostrils.   lamoTRIgine (LAMICTAL) 100 MG tablet Take 100 mg by mouth daily.   LO LOESTRIN FE 1 MG-10 MCG / 10 MCG tablet Take 1 tablet by mouth daily.  losartan (COZAAR) 50 MG tablet TAKE 1 TABLET BY MOUTH EVERY DAY   methylphenidate 54 MG PO CR tablet Take 54 mg by mouth every morning.   metoprolol succinate (TOPROL-XL) 50 MG 24 hr tablet TAKE 1 TABLET BY MOUTH DAILY. TAKE WITH OR IMMEDIATELY FOLLOWING A MEAL.   No facility-administered encounter medications on file as of 02/25/2024.  :   Review of Systems:  Out of a complete 14 point review of systems, all are reviewed and negative with the exception of these symptoms as listed below:  Review of Systems  Neurological:        Patient in room #5 and alone. Patient states she here to discuss her daytime somnolence and waking up tired. Patient states she wakes up in the middle of the night.  ESS-17 , FSS-56    Objective:  Neurological Exam  Physical Exam Physical Examination:   Vitals:   02/25/24 1257  BP: 138/87  Pulse: 92    General Examination: The patient is a very pleasant 43 y.o. female in no acute distress. She appears well-developed and well-nourished and well groomed.   HEENT: Normocephalic, atraumatic, pupils are equal, round and reactive to light, extraocular tracking is good without limitation to gaze excursion or nystagmus noted. Hearing is grossly intact. Face is symmetric with normal facial animation. Speech is clear with no dysarthria noted.  There is no hypophonia. There is no lip, neck/head, jaw or voice tremor. Neck is supple with full range of passive and active motion. There are no carotid bruits on auscultation. Oropharynx exam reveals: No significant mouth dryness, good dental hygiene, mild airway crowding secondary to small airway entry, tonsillar size of about 1-2+ bilaterally, longer uvula noted.  Minimal to mild overbite noted.  Tongue protrudes centrally and palate elevates symmetrically.    Chest: Clear to auscultation without wheezing, rhonchi or crackles noted.  Heart: S1+S2+0, regular and normal without murmurs, rubs or gallops noted.   Abdomen: Soft, non-tender and non-distended.  Extremities: There is no pitting edema in the distal lower extremities bilaterally.   Skin: Warm and dry without trophic changes noted.   Musculoskeletal: exam reveals no obvious joint deformities.   Neurologically:  Mental status: The patient is awake, alert and oriented in all 4 spheres. Her immediate and remote memory, attention, language skills and fund of knowledge are appropriate. There is no evidence of aphasia, agnosia, apraxia or anomia. Speech is clear with normal prosody and enunciation. Thought process is linear. Mood is normal and affect is normal.  Cranial nerves II - XII are as described above under HEENT exam.  Motor exam: Normal bulk, strength and tone is noted. There is no obvious action or resting tremor.  Fine motor skills and coordination: grossly intact.  Cerebellar testing: No dysmetria or intention tremor. There is no truncal or gait ataxia.  Sensory exam: intact to light touch in the upper and lower extremities.  Gait, station and balance: She stands easily. No veering to one side is noted. No leaning to one side is noted. Posture is age-appropriate and stance is narrow based. Gait shows normal stride length and normal pace. No problems turning are noted.   Assessment and plan:  In summary, Ashley Gardner is a  very pleasant 43 y.o.-year old female with an underlying medical history ofir hyperlipidemia, palpitations, hepatic steatosis, ADHD, anxiety, depression, and borderline obesity, whose history and physical exam are concerning for sleep disordered breathing, particularly obstructive sleep apnea (OSA). A laboratory attended sleep study is typically considered "  gold standard" for evaluation of sleep disordered breathing.  Her history is not compelling for narcolepsy.  Medication effect is a very possible contributor to her sleepiness and sleep disturbance.  She reports multiple nighttime awakenings.  She is strongly advised not to drive when feeling sleepy.  She is encouraged to have someone drive her and pick her up when she has to commute once a week.  Otherwise, she works from home.  I had a long chat with the patient about my findings and the diagnosis of sleep apnea, particularly OSA, its prognosis and treatment options. We talked about medical/conservative treatments, surgical interventions and non-pharmacological approaches for symptom control. I explained, in particular, the risks and ramifications of untreated moderate to severe OSA, especially with respect to developing cardiovascular disease down the road, including congestive heart failure (CHF), difficult to treat hypertension, cardiac arrhythmias (particularly A-fib), neurovascular complications including TIA, stroke and dementia. Even type 2 diabetes has, in part, been linked to untreated OSA. Symptoms of untreated OSA may include (but may not be limited to) daytime sleepiness, nocturia (i.e. frequent nighttime urination), memory problems, mood irritability and suboptimally controlled or worsening mood disorder such as depression and/or anxiety, lack of energy, lack of motivation, physical discomfort, as well as recurrent headaches, especially morning or nocturnal headaches. We talked about the importance of maintaining a healthy lifestyle and striving  for healthy weight. In addition, we talked about the importance of striving for and maintaining good sleep hygiene. I recommended a sleep study at this time. I outlined the differences between a laboratory attended sleep study which is considered more comprehensive and accurate over the option of a home sleep test (HST); the latter may lead to underestimation of sleep disordered breathing in some instances and does not help with diagnosing upper airway resistance syndrome and is not accurate enough to diagnose primary central sleep apnea typically. I outlined possible surgical and non-surgical treatment options of OSA, including the use of a positive airway pressure (PAP) device (i.e. CPAP, AutoPAP/APAP or BiPAP in certain circumstances), a custom-made dental device (aka oral appliance, which would require a referral to a specialist dentist or orthodontist typically, and is generally speaking not considered for patients with full dentures or edentulous state), upper airway surgical options, such as traditional UPPP (which is not considered a first-line treatment) or the Inspire device (hypoglossal nerve stimulator, which would involve a referral for consultation with an ENT surgeon, after careful selection, following inclusion criteria - also not first-line treatment). I explained the PAP treatment option to the patient in detail, as this is generally considered first-line treatment.  The patient indicated that she would be reluctant to try PAP therapy, if the need arises. We will pick up our discussion about the next steps and treatment options after testing.  We will keep her posted as to the test results by phone call and/or MyChart messaging where possible.  We will plan to follow-up in sleep clinic accordingly as well.  She is encouraged to make sure that she has been checked for thyroid dysfunction, vitamin D deficiency, anemia, vitamin B12 deficiency, to rule out medical causes for fatigue and sleepiness. I  answered all her questions today and the patient was in agreement.   I encouraged her to call with any interim questions, concerns, problems or updates or email [place] through MyChart.  Generally speaking, sleep test authorizations may take up to 2 weeks, sometimes less, sometimes longer, the patient is encouraged to get in touch with [place] if they do  not hear back from the sleep lab staff directly within the next 2 weeks.  Thank you very much for allowing me to participate in the care of this nice patient. If I can be of any further assistance to you please do not hesitate to call me at [phone].  Sincerely,   Huston Foley, MD, PhD

## 2024-03-10 DIAGNOSIS — F901 Attention-deficit hyperactivity disorder, predominantly hyperactive type: Secondary | ICD-10-CM | POA: Diagnosis not present

## 2024-03-10 DIAGNOSIS — F4310 Post-traumatic stress disorder, unspecified: Secondary | ICD-10-CM | POA: Diagnosis not present

## 2024-03-10 DIAGNOSIS — F411 Generalized anxiety disorder: Secondary | ICD-10-CM | POA: Diagnosis not present

## 2024-03-15 ENCOUNTER — Encounter: Payer: Self-pay | Admitting: Neurology

## 2024-03-17 DIAGNOSIS — F411 Generalized anxiety disorder: Secondary | ICD-10-CM | POA: Diagnosis not present

## 2024-03-17 DIAGNOSIS — F4310 Post-traumatic stress disorder, unspecified: Secondary | ICD-10-CM | POA: Diagnosis not present

## 2024-03-17 DIAGNOSIS — F901 Attention-deficit hyperactivity disorder, predominantly hyperactive type: Secondary | ICD-10-CM | POA: Diagnosis not present

## 2024-03-17 DIAGNOSIS — F39 Unspecified mood [affective] disorder: Secondary | ICD-10-CM | POA: Diagnosis not present

## 2024-03-24 DIAGNOSIS — R200 Anesthesia of skin: Secondary | ICD-10-CM | POA: Diagnosis not present

## 2024-03-24 DIAGNOSIS — M438X6 Other specified deforming dorsopathies, lumbar region: Secondary | ICD-10-CM | POA: Diagnosis not present

## 2024-03-24 DIAGNOSIS — I10 Essential (primary) hypertension: Secondary | ICD-10-CM | POA: Diagnosis not present

## 2024-03-24 DIAGNOSIS — F909 Attention-deficit hyperactivity disorder, unspecified type: Secondary | ICD-10-CM | POA: Diagnosis not present

## 2024-03-24 DIAGNOSIS — F332 Major depressive disorder, recurrent severe without psychotic features: Secondary | ICD-10-CM | POA: Diagnosis not present

## 2024-03-24 DIAGNOSIS — R5383 Other fatigue: Secondary | ICD-10-CM | POA: Diagnosis not present

## 2024-03-24 DIAGNOSIS — F411 Generalized anxiety disorder: Secondary | ICD-10-CM | POA: Diagnosis not present

## 2024-03-24 DIAGNOSIS — G8929 Other chronic pain: Secondary | ICD-10-CM | POA: Diagnosis not present

## 2024-03-24 DIAGNOSIS — M5441 Lumbago with sciatica, right side: Secondary | ICD-10-CM | POA: Diagnosis not present

## 2024-03-24 DIAGNOSIS — E781 Pure hyperglyceridemia: Secondary | ICD-10-CM | POA: Diagnosis not present

## 2024-03-24 DIAGNOSIS — M5442 Lumbago with sciatica, left side: Secondary | ICD-10-CM | POA: Diagnosis not present

## 2024-03-24 DIAGNOSIS — L659 Nonscarring hair loss, unspecified: Secondary | ICD-10-CM | POA: Diagnosis not present

## 2024-03-27 ENCOUNTER — Ambulatory Visit: Admitting: Neurology

## 2024-03-27 DIAGNOSIS — R400 Somnolence: Secondary | ICD-10-CM

## 2024-03-27 DIAGNOSIS — E66811 Obesity, class 1: Secondary | ICD-10-CM

## 2024-03-27 DIAGNOSIS — R519 Headache, unspecified: Secondary | ICD-10-CM

## 2024-03-27 DIAGNOSIS — Z9189 Other specified personal risk factors, not elsewhere classified: Secondary | ICD-10-CM

## 2024-03-27 DIAGNOSIS — G4733 Obstructive sleep apnea (adult) (pediatric): Secondary | ICD-10-CM

## 2024-03-27 DIAGNOSIS — R0683 Snoring: Secondary | ICD-10-CM

## 2024-03-27 DIAGNOSIS — G4719 Other hypersomnia: Secondary | ICD-10-CM

## 2024-03-31 DIAGNOSIS — F4310 Post-traumatic stress disorder, unspecified: Secondary | ICD-10-CM | POA: Diagnosis not present

## 2024-03-31 DIAGNOSIS — F901 Attention-deficit hyperactivity disorder, predominantly hyperactive type: Secondary | ICD-10-CM | POA: Diagnosis not present

## 2024-03-31 DIAGNOSIS — F411 Generalized anxiety disorder: Secondary | ICD-10-CM | POA: Diagnosis not present

## 2024-04-21 DIAGNOSIS — F39 Unspecified mood [affective] disorder: Secondary | ICD-10-CM | POA: Diagnosis not present

## 2024-04-21 DIAGNOSIS — F901 Attention-deficit hyperactivity disorder, predominantly hyperactive type: Secondary | ICD-10-CM | POA: Diagnosis not present

## 2024-04-21 DIAGNOSIS — F4310 Post-traumatic stress disorder, unspecified: Secondary | ICD-10-CM | POA: Diagnosis not present

## 2024-04-21 DIAGNOSIS — F411 Generalized anxiety disorder: Secondary | ICD-10-CM | POA: Diagnosis not present

## 2024-04-24 NOTE — Progress Notes (Signed)
 See procedure note.

## 2024-04-27 ENCOUNTER — Ambulatory Visit: Payer: Self-pay | Admitting: Neurology

## 2024-04-27 DIAGNOSIS — F4310 Post-traumatic stress disorder, unspecified: Secondary | ICD-10-CM | POA: Diagnosis not present

## 2024-04-27 DIAGNOSIS — F411 Generalized anxiety disorder: Secondary | ICD-10-CM | POA: Diagnosis not present

## 2024-04-27 DIAGNOSIS — F901 Attention-deficit hyperactivity disorder, predominantly hyperactive type: Secondary | ICD-10-CM | POA: Diagnosis not present

## 2024-04-27 DIAGNOSIS — F39 Unspecified mood [affective] disorder: Secondary | ICD-10-CM | POA: Diagnosis not present

## 2024-04-27 NOTE — Procedures (Signed)
   [facility]  HOME SLEEP TEST (SANSA) REPORT (Mail-Out Device):   STUDY DATE: 04/19/2024  DOB: 1981/04/04  MRN: [id number]  ORDERING CLINICIAN: Debbra Fairy, MD, PhD   REFERRING CLINICIAN: Vern Going, NP  CLINICAL INFORMATION/HISTORY: 43 year old female with an underlying medical history ofir hyperlipidemia, palpitations, hepatic steatosis, ADHD, anxiety, depression, and borderline obesity, who excessive daytime somnolence for the past 4 or 5 months.  She reports a history of difficulty initiating and maintaining sleep.    PATIENT'S LAST REPORTED EPWORTH SLEEPINESS SCORE (ESS): 17/24.  BMI (at the time of sleep clinic visit and/or test date): 30.9 kg/m  FINDINGS:   Study Protocol:    The SANSA single-point-of-skin-contact chest-worn sensor - an FDA cleared and DOT approved type 4 home sleep test device - measures eight physiological channels,  including blood oxygen saturation (measured via PPG [photoplethysmography]), EKG-derived heart rate, respiratory effort, chest movement (measured via accelerometer), snoring, body position, and actigraphy. The device is designed to be worn for up to 10 hours per study.   Sleep Summary:   Total Recording Time (hours, min): 9 hours, 28 min  Total Effective Sleep Time (hours, min):  7 hours, 33 min  Sleep Efficiency (%):    80%   Respiratory Indices:   Calculated sAHI (per hour):  0.8/hour         Oxygen Saturation Statistics:    Oxygen Saturation (%) Mean: 96.9%   Minimum oxygen saturation (%):                 86.3%   O2 Saturation Range (%): 86.3 - 100%   Time below or at 88% saturation: 0 min   Pulse Rate Statistics:   Pulse Mean (bpm):    71/min    Pulse Range (60 - 111/min)   Snoring: Mild, intermittent  IMPRESSION/DIAGNOSES:    Primary snoring   RECOMMENDATIONS:   This home sleep test does not demonstrate any significant obstructive or central sleep disordered breathing with a total AHI  of less than 5/hour.  Her total AHI was 0.8/h, O2 nadir 86.3% with intermittent mild snoring detected. Treatment with a positive airway pressure device such as AutoPap or CPAP is not indicated based on this test. Snoring may improve with avoidance of the supine sleep position and weight loss (where clinically appropriate).   Other causes of the patient's symptoms, including circadian rhythm disturbances, an underlying mood disorder, medication effect and/or an underlying medical problem cannot be ruled out based on this test. Clinical correlation is recommended.  The patient should be cautioned not to drive, work at heights, or operate dangerous or heavy equipment when tired or sleepy. Review and reiteration of good sleep hygiene measures should be pursued with any patient. The patient will be advised to follow up with her referring provider, who will be notified of the test results.   I certify that I have reviewed the raw data recording prior to the issuance of this report in accordance with the standards of the American Academy of Sleep Medicine (AASM).  INTERPRETING PHYSICIAN:   Debbra Fairy, MD, PhD Medical Director, [facility]) Diplomat, ABPN (Neurology and Sleep)   [facility] [address] [place], [place] [address] [phone]

## 2024-04-30 NOTE — Telephone Encounter (Signed)
-----   Message from Debbra Fairy sent at 04/27/2024  5:57 PM EDT ----- Patient was referred by  Vern Going, NP for daytime somnolence.  I saw her on 02/25/2024.  She had a home sleep test on 04/19/2024.  Please advise patient that the home sleep test did not show any significant obstructive sleep apnea and treatment with a CPAP or similar machine is not necessary.  In order to further evaluate her for significant daytime sleepiness, with an extended sleep study in the sleep lab including a nighttime sleep study and a daytime nap test, as discussed during the appointment, she would have to taper off of multiple medications, I recommend she discuss this with her prescriber.  In particular, she would have to come off of Lamictal, bupropion , buspirone , methylphenidate , and hydroxyzine.  She indicated during the appointment that she was no longer on bupropion  and hydroxyzine.

## 2024-04-30 NOTE — Telephone Encounter (Signed)
 Spoke to patient gave sleep study results Gave Dr Alferd Angers recommendation In order to further evaluate her for significant daytime sleepiness, with an extended sleep study in the sleep lab including a nighttime sleep study and a daytime nap test, as discussed during the appointment, she would have to taper off of multiple medications, I recommend she discuss this with her prescriber , Pt does want to take extended sleep study however very hesitant due to tapering off certain medications Pt will contact Vern Going, NP (Prescriber) to discuss futher and call us  back to tell us  how she will move forward with testing Pt did ask to forward sleep study results to Vern Going, NP . Sent sleep study results to NP . Pt thanked me for calling

## 2024-05-13 DIAGNOSIS — F330 Major depressive disorder, recurrent, mild: Secondary | ICD-10-CM | POA: Diagnosis not present

## 2024-05-17 ENCOUNTER — Other Ambulatory Visit: Payer: Self-pay | Admitting: Family Medicine

## 2024-05-17 DIAGNOSIS — I10 Essential (primary) hypertension: Secondary | ICD-10-CM

## 2024-05-20 DIAGNOSIS — R5383 Other fatigue: Secondary | ICD-10-CM | POA: Diagnosis not present

## 2024-05-20 DIAGNOSIS — E559 Vitamin D deficiency, unspecified: Secondary | ICD-10-CM | POA: Diagnosis not present

## 2024-05-20 DIAGNOSIS — Z1329 Encounter for screening for other suspected endocrine disorder: Secondary | ICD-10-CM | POA: Diagnosis not present

## 2024-05-20 DIAGNOSIS — E538 Deficiency of other specified B group vitamins: Secondary | ICD-10-CM | POA: Diagnosis not present

## 2024-05-20 DIAGNOSIS — N926 Irregular menstruation, unspecified: Secondary | ICD-10-CM | POA: Diagnosis not present

## 2024-05-20 DIAGNOSIS — R7303 Prediabetes: Secondary | ICD-10-CM | POA: Diagnosis not present

## 2024-05-20 DIAGNOSIS — E039 Hypothyroidism, unspecified: Secondary | ICD-10-CM | POA: Diagnosis not present

## 2024-05-20 DIAGNOSIS — E639 Nutritional deficiency, unspecified: Secondary | ICD-10-CM | POA: Diagnosis not present

## 2024-05-20 DIAGNOSIS — Z1321 Encounter for screening for nutritional disorder: Secondary | ICD-10-CM | POA: Diagnosis not present

## 2024-06-19 ENCOUNTER — Other Ambulatory Visit: Payer: Self-pay | Admitting: Family Medicine

## 2024-06-19 DIAGNOSIS — I10 Essential (primary) hypertension: Secondary | ICD-10-CM

## 2024-06-23 DIAGNOSIS — N926 Irregular menstruation, unspecified: Secondary | ICD-10-CM | POA: Diagnosis not present

## 2024-06-23 DIAGNOSIS — R7303 Prediabetes: Secondary | ICD-10-CM | POA: Diagnosis not present

## 2024-06-23 DIAGNOSIS — E039 Hypothyroidism, unspecified: Secondary | ICD-10-CM | POA: Diagnosis not present

## 2024-06-23 DIAGNOSIS — I10 Essential (primary) hypertension: Secondary | ICD-10-CM | POA: Diagnosis not present

## 2024-09-15 DIAGNOSIS — Z1321 Encounter for screening for nutritional disorder: Secondary | ICD-10-CM | POA: Diagnosis not present

## 2024-09-15 DIAGNOSIS — E7212 Methylenetetrahydrofolate reductase deficiency: Secondary | ICD-10-CM | POA: Diagnosis not present

## 2024-09-15 DIAGNOSIS — E559 Vitamin D deficiency, unspecified: Secondary | ICD-10-CM | POA: Diagnosis not present

## 2024-09-15 DIAGNOSIS — E039 Hypothyroidism, unspecified: Secondary | ICD-10-CM | POA: Diagnosis not present

## 2024-09-15 DIAGNOSIS — E617 Deficiency of multiple nutrient elements: Secondary | ICD-10-CM | POA: Diagnosis not present

## 2024-09-15 DIAGNOSIS — R7303 Prediabetes: Secondary | ICD-10-CM | POA: Diagnosis not present

## 2024-09-15 DIAGNOSIS — N926 Irregular menstruation, unspecified: Secondary | ICD-10-CM | POA: Diagnosis not present

## 2024-09-28 ENCOUNTER — Ambulatory Visit: Admitting: Podiatry

## 2024-09-28 ENCOUNTER — Encounter: Payer: Self-pay | Admitting: Podiatry

## 2024-09-28 ENCOUNTER — Ambulatory Visit (INDEPENDENT_AMBULATORY_CARE_PROVIDER_SITE_OTHER)

## 2024-09-28 DIAGNOSIS — M722 Plantar fascial fibromatosis: Secondary | ICD-10-CM | POA: Diagnosis not present

## 2024-09-28 DIAGNOSIS — M79671 Pain in right foot: Secondary | ICD-10-CM

## 2024-09-28 DIAGNOSIS — M79672 Pain in left foot: Secondary | ICD-10-CM

## 2024-09-28 MED ORDER — TRIAMCINOLONE ACETONIDE 10 MG/ML IJ SUSP
10.0000 mg | Freq: Once | INTRAMUSCULAR | Status: AC
  Administered 2024-09-28: 10 mg via INTRA_ARTICULAR

## 2024-09-28 MED ORDER — DICLOFENAC SODIUM 75 MG PO TBEC: 75.0000 mg | DELAYED_RELEASE_TABLET | Freq: Two times a day (BID) | ORAL | 2 refills | Status: DC

## 2024-09-28 NOTE — Patient Instructions (Signed)

## 2024-09-29 DIAGNOSIS — E039 Hypothyroidism, unspecified: Secondary | ICD-10-CM | POA: Diagnosis not present

## 2024-09-29 DIAGNOSIS — N926 Irregular menstruation, unspecified: Secondary | ICD-10-CM | POA: Diagnosis not present

## 2024-09-29 DIAGNOSIS — R7303 Prediabetes: Secondary | ICD-10-CM | POA: Diagnosis not present

## 2024-09-29 DIAGNOSIS — I10 Essential (primary) hypertension: Secondary | ICD-10-CM | POA: Diagnosis not present

## 2024-09-30 NOTE — Progress Notes (Signed)
 Subjective:   Patient ID: Ashley Gardner, female   DOB: 43 y.o.   MRN: [id number]   HPI Patient presents with chronic pain in the heel region of both feet and states that it is been going on for a fairly long time worse recently.  Patient states that she does wear good supportive shoe gear and patient does not smoke likes to be active   Review of Systems  All other systems reviewed and are negative.       Objective:  Physical Exam Vitals and nursing note reviewed.  Constitutional:      Appearance: She is well-developed.  Pulmonary:     Effort: Pulmonary effort is normal.  Musculoskeletal:        General: Normal range of motion.  Skin:    General: Skin is warm.  Neurological:     Mental Status: She is alert.     Neurovascular status intact muscle strength was found to be adequate range of motion adequate.  Patient is noted to have exquisite discomfort in the medial band of the plantar fascia bilaterally with fluid buildup moderate depression of the arch good digital perfusion well oriented in mild equinus condition     Assessment:  Acute plantar fasciitis bilateral with probable structural issues as part of the pathology     Plan:  H&P x-rays taken reviewed and I went ahead today did sterile prep and injected the fascia at insertion bilateral 3 mg Kenalog 5 mg Xylocaine and applied fascial bands bilaterally with instructions on usage.  Patient also had orthotic discussion and we will look at this at next visit and that will be a viable long-term option for her.  Also placed patient on diclofenac 75 mg twice daily and provided education for this  X-rays indicate that there is moderate depression of the arch no indication stress fracture or arthritis

## 2024-10-19 ENCOUNTER — Telehealth: Payer: Self-pay | Admitting: Podiatry

## 2024-10-19 ENCOUNTER — Ambulatory Visit (INDEPENDENT_AMBULATORY_CARE_PROVIDER_SITE_OTHER): Payer: Self-pay | Admitting: Podiatry

## 2024-10-19 DIAGNOSIS — Z91199 Patient's noncompliance with other medical treatment and regimen due to unspecified reason: Secondary | ICD-10-CM

## 2024-10-19 NOTE — Telephone Encounter (Signed)
 Called to inform patient of No Show Policy.

## 2024-10-19 NOTE — Progress Notes (Signed)
 Did not come in for scheduled appointment

## 2024-10-22 ENCOUNTER — Ambulatory Visit (INDEPENDENT_AMBULATORY_CARE_PROVIDER_SITE_OTHER): Admitting: Podiatry

## 2024-10-22 ENCOUNTER — Encounter: Payer: Self-pay | Admitting: Podiatry

## 2024-10-22 DIAGNOSIS — M722 Plantar fascial fibromatosis: Secondary | ICD-10-CM

## 2024-10-24 NOTE — Progress Notes (Signed)
 Subjective:   Patient ID: Ashley Gardner, female   DOB: 43 y.o.   MRN: [id number]   HPI Patient states she is improving a lot with her pain in her feet and would like this to stay this way and does have plantar arch structure   ROS      Objective:  Physical Exam  Neurovascular status intact significant improvement in pain in the plantar fascia at insertion bilateral with moderate depression of the arch collapse of the medial longitudinal arch that is occurring with his pain being present for a long time and bilateral     Assessment:  Chronic plantar fasciitis bilateral that I do think is improving but does have structural deformity young age and long-term history of probable       Plan:  H&P education rendered and at this point I did discuss continued stretching and shoe gear modifications ice and I casted for functional orthotic after pedorthist reviewed condition.  I do think this will be the best chance to prevent long-term problems and still may require other treatments depending on progress.  Reappoint when orthotics return

## 2024-11-09 DIAGNOSIS — F411 Generalized anxiety disorder: Secondary | ICD-10-CM | POA: Diagnosis not present

## 2024-11-09 DIAGNOSIS — R7303 Prediabetes: Secondary | ICD-10-CM | POA: Diagnosis not present

## 2024-11-09 DIAGNOSIS — E559 Vitamin D deficiency, unspecified: Secondary | ICD-10-CM | POA: Diagnosis not present

## 2024-11-09 DIAGNOSIS — E039 Hypothyroidism, unspecified: Secondary | ICD-10-CM | POA: Diagnosis not present

## 2024-11-09 DIAGNOSIS — E781 Pure hyperglyceridemia: Secondary | ICD-10-CM | POA: Diagnosis not present

## 2024-11-09 DIAGNOSIS — E538 Deficiency of other specified B group vitamins: Secondary | ICD-10-CM | POA: Diagnosis not present

## 2024-11-09 DIAGNOSIS — Z1330 Encounter for screening examination for mental health and behavioral disorders, unspecified: Secondary | ICD-10-CM | POA: Diagnosis not present

## 2024-11-09 DIAGNOSIS — F909 Attention-deficit hyperactivity disorder, unspecified type: Secondary | ICD-10-CM | POA: Diagnosis not present

## 2024-11-09 DIAGNOSIS — I10 Essential (primary) hypertension: Secondary | ICD-10-CM | POA: Diagnosis not present

## 2024-11-16 ENCOUNTER — Telehealth: Payer: Self-pay

## 2024-11-16 NOTE — Telephone Encounter (Signed)
 Orthotics are in. Balance is $0 [place] patient.

## 2024-11-19 NOTE — Telephone Encounter (Signed)
 Called patient to schedule appointment. Unable to reach. LVM

## 2024-12-17 ENCOUNTER — Ambulatory Visit: Admitting: Podiatry

## 2024-12-20 ENCOUNTER — Other Ambulatory Visit: Payer: Self-pay | Admitting: Podiatry
